# Patient Record
Sex: Male | Born: 1960 | Race: Black or African American | Hispanic: No | Marital: Single | Smoking: Current every day smoker
Health system: Southern US, Community
[De-identification: ages and names within clinical notes are randomized; demographics above are authoritative.]

## PROBLEM LIST (undated history)

## (undated) DIAGNOSIS — M1990 Unspecified osteoarthritis, unspecified site: Secondary | ICD-10-CM

## (undated) HISTORY — PX: FRACTURE SURGERY: SHX138

---

## 2015-12-31 ENCOUNTER — Emergency Department (HOSPITAL_COMMUNITY)
Admission: EM | Admit: 2015-12-31 | Discharge: 2015-12-31 | Disposition: A | Discharge status: Home / Self Care | Payer: Self-pay | Attending: Emergency Medicine | Admitting: Emergency Medicine

## 2015-12-31 ENCOUNTER — Encounter (HOSPITAL_COMMUNITY): Payer: Self-pay | Admitting: Family Medicine

## 2015-12-31 DIAGNOSIS — F17210 Nicotine dependence, cigarettes, uncomplicated: Secondary | ICD-10-CM | POA: Insufficient documentation

## 2015-12-31 DIAGNOSIS — R197 Diarrhea, unspecified: Secondary | ICD-10-CM | POA: Insufficient documentation

## 2015-12-31 DIAGNOSIS — K219 Gastro-esophageal reflux disease without esophagitis: Secondary | ICD-10-CM | POA: Insufficient documentation

## 2015-12-31 DIAGNOSIS — R112 Nausea with vomiting, unspecified: Secondary | ICD-10-CM

## 2015-12-31 LAB — URINALYSIS, ROUTINE W REFLEX MICROSCOPIC
BILIRUBIN URINE: NEGATIVE
GLUCOSE, UA: NEGATIVE mg/dL
HGB URINE DIPSTICK: NEGATIVE
KETONES UR: NEGATIVE mg/dL
LEUKOCYTES UA: NEGATIVE
Nitrite: NEGATIVE
PH: 5 (ref 5.0–8.0)
PROTEIN: NEGATIVE mg/dL
Specific Gravity, Urine: 1.023 (ref 1.005–1.030)

## 2015-12-31 LAB — COMPREHENSIVE METABOLIC PANEL
ALBUMIN: 3.8 g/dL (ref 3.5–5.0)
ALK PHOS: 45 U/L (ref 38–126)
ALT: 12 U/L — ABNORMAL LOW (ref 17–63)
ANION GAP: 9 (ref 5–15)
AST: 21 U/L (ref 15–41)
BILIRUBIN TOTAL: 0.7 mg/dL (ref 0.3–1.2)
BUN: 7 mg/dL (ref 6–20)
CALCIUM: 9.2 mg/dL (ref 8.9–10.3)
CO2: 22 mmol/L (ref 22–32)
Chloride: 109 mmol/L (ref 101–111)
Creatinine, Ser: 0.96 mg/dL (ref 0.61–1.24)
GLUCOSE: 97 mg/dL (ref 65–99)
Potassium: 3.8 mmol/L (ref 3.5–5.1)
Sodium: 140 mmol/L (ref 135–145)
TOTAL PROTEIN: 6.7 g/dL (ref 6.5–8.1)

## 2015-12-31 LAB — CBC
HEMATOCRIT: 40.6 % (ref 39.0–52.0)
Hemoglobin: 12.9 g/dL — ABNORMAL LOW (ref 13.0–17.0)
MCH: 29 pg (ref 26.0–34.0)
MCHC: 31.8 g/dL (ref 30.0–36.0)
MCV: 91.2 fL (ref 78.0–100.0)
PLATELETS: 203 10*3/uL (ref 150–400)
RBC: 4.45 MIL/uL (ref 4.22–5.81)
RDW: 15 % (ref 11.5–15.5)
WBC: 5.6 10*3/uL (ref 4.0–10.5)

## 2015-12-31 LAB — LIPASE, BLOOD: Lipase: 17 U/L (ref 11–51)

## 2015-12-31 MED ORDER — OMEPRAZOLE 20 MG PO CPDR: 20.0000 mg | DELAYED_RELEASE_CAPSULE | Freq: Every day | ORAL | Status: DC

## 2015-12-31 MED ORDER — ONDANSETRON HCL 4 MG/2ML IJ SOLN
4.0000 mg | Freq: Once | INTRAMUSCULAR | Status: AC
  Administered 2015-12-31: 4 mg via INTRAVENOUS
  Filled 2015-12-31: qty 2

## 2015-12-31 MED ORDER — GI COCKTAIL ~~LOC~~
30.0000 mL | Freq: Once | ORAL | Status: AC
  Administered 2015-12-31: 30 mL via ORAL
  Filled 2015-12-31: qty 30

## 2015-12-31 MED ORDER — SODIUM CHLORIDE 0.9 % IV BOLUS (SEPSIS)
1000.0000 mL | Freq: Once | INTRAVENOUS | Status: AC
  Administered 2015-12-31: 1000 mL via INTRAVENOUS

## 2015-12-31 MED ORDER — ONDANSETRON 4 MG PO TBDP: 4.0000 mg | ORAL_TABLET | Freq: Three times a day (TID) | ORAL | Status: DC | PRN

## 2015-12-31 NOTE — Discharge Instructions (Signed)

## 2015-12-31 NOTE — ED Provider Notes (Signed)
CSN: [id number]     Arrival date & time 12/31/15  1613 History   First MD Initiated Contact with Patient 12/31/15 1910     Chief Complaint  Patient presents with  . Abdominal Pain  . Nausea  . Emesis    Travis Heath is a 55 y.o. who presents to the ED complaining of nausea, vomiting, diarrhea and generalized abdominal pain since this morning. He believes he has a stomach virus after eating some bad mayonnaise. He reports he's had 3 episodes of vomiting and multiple episodes of diarrhea today. He reports feeling nauseated. No hematemesis or hematochezia. He reports he's had lots of burping and belching. He reports his belching smells like roten eggs. No previous abdominal surgeries. He has taken nothing for treatment of his symptoms today. He denies fevers, urinary symptoms, hematemesis, hematochezia, lightheadedness, dizziness, syncope or rashes.   Patient is a 55 y.o. male presenting with abdominal pain and vomiting. The history is provided by the patient. No language interpreter was used.  Abdominal Pain Associated symptoms: diarrhea, nausea and vomiting   Associated symptoms: no chest pain, no chills, no cough, no dysuria, no fever, no hematuria, no shortness of breath and no sore throat   Emesis Associated symptoms: abdominal pain and diarrhea   Associated symptoms: no chills, no headaches and no sore throat     History reviewed. No pertinent past medical history. History reviewed. No pertinent past surgical history. History reviewed. No pertinent family history. Social History  Substance Use Topics  . Smoking status: Current Every Day Smoker    Types: Cigarettes  . Smokeless tobacco: None  . Alcohol Use: No    Review of Systems  Constitutional: Negative for fever and chills.  HENT: Negative for congestion and sore throat.   Eyes: Negative for visual disturbance.  Respiratory: Negative for cough and shortness of breath.   Cardiovascular: Negative for chest pain.   Gastrointestinal: Positive for nausea, vomiting, abdominal pain and diarrhea. Negative for blood in stool and abdominal distention.  Genitourinary: Negative for dysuria, urgency, frequency, hematuria and difficulty urinating.  Musculoskeletal: Negative for back pain and neck pain.  Skin: Negative for rash.  Neurological: Negative for syncope and headaches.      Allergies  Review of patient's allergies indicates no known allergies.  Home Medications   Prior to Admission medications   Medication Sig Start Date End Date Taking? Authorizing Provider  acetaminophen (TYLENOL) 500 MG tablet Take 500 mg by mouth every 6 (six) hours as needed for moderate pain.   Yes Historical Provider, MD  omeprazole (PRILOSEC) 20 MG capsule Take 1 capsule (20 mg total) by mouth daily. 12/31/15   Everlene Farrier, PA-C  ondansetron (ZOFRAN ODT) 4 MG disintegrating tablet Take 1 tablet (4 mg total) by mouth every 8 (eight) hours as needed for nausea or vomiting. 12/31/15   Everlene Farrier, PA-C   BP 126/73 mmHg  Pulse 54  Temp(Src) 98.1 F (36.7 C)  Resp 18  SpO2 100% Physical Exam  Constitutional: He appears well-developed and well-nourished. No distress.  Nontoxic appearing.  HENT:  Head: Normocephalic and atraumatic.  Mouth/Throat: Oropharynx is clear and moist.  Eyes: Conjunctivae are normal. Pupils are equal, round, and reactive to light. Right eye exhibits no discharge. Left eye exhibits no discharge.  Neck: Neck supple.  Cardiovascular: Normal rate, regular rhythm, normal heart sounds and intact distal pulses.   Pulmonary/Chest: Effort normal and breath sounds normal. No respiratory distress. He has no wheezes. He has no rales.  Abdominal:  Soft. Bowel sounds are normal. He exhibits no distension and no mass. There is no tenderness. There is no rebound and no guarding.  Abdomen soft and nontender to palpation. Bowel sounds are present. No peritoneal signs.  Musculoskeletal: He exhibits no edema or  tenderness.  Lymphadenopathy:    He has no cervical adenopathy.  Neurological: He is alert. Coordination normal.  Skin: Skin is warm and dry. No rash noted. He is not diaphoretic. No erythema. No pallor.  Psychiatric: He has a normal mood and affect. His behavior is normal.  Nursing note and vitals reviewed.   ED Course  Procedures (including critical care time) Labs Review Labs Reviewed  COMPREHENSIVE METABOLIC PANEL - Abnormal; Notable for the following:    ALT 12 (*)    All other components within normal limits  CBC - Abnormal; Notable for the following:    Hemoglobin 12.9 (*)    All other components within normal limits  LIPASE, BLOOD  URINALYSIS, ROUTINE W REFLEX MICROSCOPIC ([facility])    Imaging Review No results found. I have personally reviewed and evaluated these lab results as part of my medical decision-making.   EKG Interpretation None     Filed Vitals:   12/31/15 1621  BP: 126/73  Pulse: 54  Temp: 98.1 F (36.7 C)  Resp: 18  SpO2: 100%    MDM   Meds given in ED:  Medications  sodium chloride 0.9 % bolus 1,000 mL (1,000 mLs Intravenous New Bag/Given 12/31/15 1956)  ondansetron (ZOFRAN) injection 4 mg (4 mg Intravenous Given 12/31/15 1957)  gi cocktail (Maalox,Lidocaine,Donnatal) (30 mLs Oral Given 12/31/15 1956)    New Prescriptions   OMEPRAZOLE (PRILOSEC) 20 MG CAPSULE    Take 1 capsule (20 mg total) by mouth daily.   ONDANSETRON (ZOFRAN ODT) 4 MG DISINTEGRATING TABLET    Take 1 tablet (4 mg total) by mouth every 8 (eight) hours as needed for nausea or vomiting.    Final diagnoses:  Nausea vomiting and diarrhea  Gastroesophageal reflux disease, esophagitis presence not specified   This is a 55 y.o. who presents to the ED complaining of nausea, vomiting, diarrhea and generalized abdominal pain since this morning. He believes he has a stomach virus after eating some bad mayonnaise. He reports he's had 3 episodes of vomiting and multiple episodes of  diarrhea today. He reports feeling nauseated. No hematemesis or hematochezia. He reports he's had lots of burping and belching. He reports his belching smells like roten eggs. No previous abdominal surgeries. On exam the patient is afebrile and nontoxic appearing. His abdomen is soft and nontender to palpation. No peritoneal signs. He is belching during my interview. Lipase is within normal limits. CMP is unremarkable. CBC is unremarkable. Urinalysis is within normal limits. No signs of infection. No leukocytosis on CBC. Patient received GI cocktail, Zofran and fluid bolus. At reevaluation patient feels much better. He has tolerated ginger ale without vomiting. Will discharge with prescriptions for Zofran and omeprazole. I encouraged him to push oral fluids and to use over-the-counter loperamide if needed for any continued diarrhea. He has had no diarrhea or vomiting while in the emergency department. I discussed strict and specific return precautions. I advised the patient to follow-up with their primary care provider this week. I advised the patient to return to the emergency department with new or worsening symptoms or new concerns. The patient verbalized understanding and agreement with plan.    Everlene FarrierWilliam Keeghan Bialy, PA-C 12/31/15 2123  Margarita Grizzleanielle Ray, MD 01/01/16 0010

## 2015-12-31 NOTE — ED Notes (Signed)
Pt here for mid abd pain(sts feels like a knot) with N,V,D that started this am. sts he is burping up eggs and possibly ate some bad mayo.

## 2016-02-12 ENCOUNTER — Emergency Department (HOSPITAL_COMMUNITY)
Admission: EM | Admit: 2016-02-12 | Discharge: 2016-02-12 | Disposition: A | Discharge status: Home / Self Care | Payer: Self-pay | Attending: Emergency Medicine | Admitting: Emergency Medicine

## 2016-02-12 ENCOUNTER — Encounter (HOSPITAL_COMMUNITY): Payer: Self-pay | Admitting: Emergency Medicine

## 2016-02-12 DIAGNOSIS — F17210 Nicotine dependence, cigarettes, uncomplicated: Secondary | ICD-10-CM | POA: Insufficient documentation

## 2016-02-12 DIAGNOSIS — R197 Diarrhea, unspecified: Secondary | ICD-10-CM | POA: Insufficient documentation

## 2016-02-12 DIAGNOSIS — R001 Bradycardia, unspecified: Secondary | ICD-10-CM | POA: Insufficient documentation

## 2016-02-12 DIAGNOSIS — Z79899 Other long term (current) drug therapy: Secondary | ICD-10-CM | POA: Insufficient documentation

## 2016-02-12 DIAGNOSIS — R109 Unspecified abdominal pain: Secondary | ICD-10-CM | POA: Insufficient documentation

## 2016-02-12 LAB — URINALYSIS, ROUTINE W REFLEX MICROSCOPIC
Bilirubin Urine: NEGATIVE
GLUCOSE, UA: NEGATIVE mg/dL
Hgb urine dipstick: NEGATIVE
KETONES UR: NEGATIVE mg/dL
LEUKOCYTES UA: NEGATIVE
NITRITE: NEGATIVE
PH: 6.5 (ref 5.0–8.0)
PROTEIN: NEGATIVE mg/dL
Specific Gravity, Urine: 1.029 (ref 1.005–1.030)

## 2016-02-12 LAB — COMPREHENSIVE METABOLIC PANEL
ALT: 11 U/L — ABNORMAL LOW (ref 17–63)
ANION GAP: 5 (ref 5–15)
AST: 17 U/L (ref 15–41)
Albumin: 3.7 g/dL (ref 3.5–5.0)
Alkaline Phosphatase: 52 U/L (ref 38–126)
BILIRUBIN TOTAL: 0.6 mg/dL (ref 0.3–1.2)
BUN: 8 mg/dL (ref 6–20)
CHLORIDE: 108 mmol/L (ref 101–111)
CO2: 28 mmol/L (ref 22–32)
Calcium: 9.5 mg/dL (ref 8.9–10.3)
Creatinine, Ser: 1.08 mg/dL (ref 0.61–1.24)
Glucose, Bld: 117 mg/dL — ABNORMAL HIGH (ref 65–99)
POTASSIUM: 4.5 mmol/L (ref 3.5–5.1)
Sodium: 141 mmol/L (ref 135–145)
TOTAL PROTEIN: 6.7 g/dL (ref 6.5–8.1)

## 2016-02-12 LAB — CBC
HEMATOCRIT: 41.4 % (ref 39.0–52.0)
Hemoglobin: 13 g/dL (ref 13.0–17.0)
MCH: 29.4 pg (ref 26.0–34.0)
MCHC: 31.4 g/dL (ref 30.0–36.0)
MCV: 93.7 fL (ref 78.0–100.0)
Platelets: 215 10*3/uL (ref 150–400)
RBC: 4.42 MIL/uL (ref 4.22–5.81)
RDW: 14.2 % (ref 11.5–15.5)
WBC: 6.9 10*3/uL (ref 4.0–10.5)

## 2016-02-12 LAB — LIPASE, BLOOD: LIPASE: 22 U/L (ref 11–51)

## 2016-02-12 MED ORDER — ONDANSETRON HCL 4 MG PO TABS: 4.0000 mg | ORAL_TABLET | Freq: Four times a day (QID) | ORAL | Status: DC

## 2016-02-12 MED ORDER — ONDANSETRON 4 MG PO TBDP
8.0000 mg | ORAL_TABLET | Freq: Once | ORAL | Status: AC
  Administered 2016-02-12: 8 mg via ORAL
  Filled 2016-02-12: qty 2

## 2016-02-12 NOTE — ED Notes (Signed)
Patient able to ambulate independently  

## 2016-02-12 NOTE — ED Provider Notes (Signed)
CSN: [id number]     Arrival date & time 02/12/16  1556 History   First MD Initiated Contact with Patient 02/12/16 2001     Chief Complaint  Patient presents with  . Abdominal Pain  . Diarrhea     (Consider location/radiation/quality/duration/timing/severity/associated sxs/prior Treatment) HPI Comments: Patient presents to the emergency department with chief complaint of abdominal cramping and diarrhea. Patient states that he began having the symptoms at work today. States that he had 4 episodes of watery diarrhea. Denies seeing any blood in his stool. Denies any vomiting, but does state that he felt slightly nauseated. He denies any associated fevers or chills. There are no modifying factors. He states that he is now feeling much better.  The history is provided by the patient. No language interpreter was used.    History reviewed. No pertinent past medical history. Past Surgical History  Procedure Laterality Date  . Fracture surgery      facial    No family history on file. Social History  Substance Use Topics  . Smoking status: Current Every Day Smoker -- 0.50 packs/day    Types: Cigarettes  . Smokeless tobacco: None  . Alcohol Use: None    Review of Systems  Constitutional: Negative for fever and chills.  Respiratory: Negative for shortness of breath.   Cardiovascular: Negative for chest pain.  Gastrointestinal: Positive for diarrhea. Negative for nausea, vomiting and constipation.  Genitourinary: Negative for dysuria.  All other systems reviewed and are negative.     Allergies  Review of patient's allergies indicates no known allergies.  Home Medications   Prior to Admission medications   Medication Sig Start Date End Date Taking? Authorizing Provider  acetaminophen (TYLENOL) 500 MG tablet Take 500 mg by mouth every 6 (six) hours as needed for moderate pain.   Yes Historical Provider, MD  omeprazole (PRILOSEC) 20 MG capsule Take 1 capsule (20 mg total) by mouth  daily. 12/31/15   Everlene FarrierWilliam Dansie, PA-C  ondansetron (ZOFRAN ODT) 4 MG disintegrating tablet Take 1 tablet (4 mg total) by mouth every 8 (eight) hours as needed for nausea or vomiting. 12/31/15   Everlene FarrierWilliam Dansie, PA-C   BP 132/93 mmHg  Pulse 49  Temp(Src) 97.8 F (36.6 C) (Oral)  Resp 18  Ht 5\' 11"  (1.803 m)  Wt 83.915 kg  BMI 25.81 kg/m2  SpO2 100% Physical Exam  Constitutional: He is oriented to person, place, and time. He appears well-developed and well-nourished.  HENT:  Head: Normocephalic and atraumatic.  Eyes: Conjunctivae and EOM are normal. Pupils are equal, round, and reactive to light. Right eye exhibits no discharge. Left eye exhibits no discharge. No scleral icterus.  Neck: Normal range of motion. Neck supple. No JVD present.  Cardiovascular: Regular rhythm and normal heart sounds.  Exam reveals no gallop and no friction rub.   No murmur heard. bradycardic  Pulmonary/Chest: Effort normal and breath sounds normal. No respiratory distress. He has no wheezes. He has no rales. He exhibits no tenderness.  Abdominal: Soft. He exhibits no distension and no mass. There is no tenderness. There is no rebound and no guarding.  No focal abdominal tenderness, no RLQ tenderness or pain at McBurney's point, no RUQ tenderness or Murphy's sign, no left-sided abdominal tenderness, no fluid wave, or signs of peritonitis   Musculoskeletal: Normal range of motion. He exhibits no edema or tenderness.  Neurological: He is alert and oriented to person, place, and time.  Skin: Skin is warm and dry.  Psychiatric: He has  a normal mood and affect. His behavior is normal. Judgment and thought content normal.  Nursing note and vitals reviewed.   ED Course  Procedures (including critical care time) Results for orders placed or performed during the hospital encounter of 02/12/16  Lipase, blood  Result Value Ref Range   Lipase 22 11 - 51 U/L  Comprehensive metabolic panel  Result Value Ref Range    Sodium 141 135 - 145 mmol/L   Potassium 4.5 3.5 - 5.1 mmol/L   Chloride 108 101 - 111 mmol/L   CO2 28 22 - 32 mmol/L   Glucose, Bld 117 (H) 65 - 99 mg/dL   BUN 8 6 - 20 mg/dL   Creatinine, Ser 1.61 0.61 - 1.24 mg/dL   Calcium 9.5 8.9 - 09.6 mg/dL   Total Protein 6.7 6.5 - 8.1 g/dL   Albumin 3.7 3.5 - 5.0 g/dL   AST 17 15 - 41 U/L   ALT 11 (L) 17 - 63 U/L   Alkaline Phosphatase 52 38 - 126 U/L   Total Bilirubin 0.6 0.3 - 1.2 mg/dL   GFR calc non Af Amer >60 >60 mL/min   GFR calc Af Amer >60 >60 mL/min   Anion gap 5 5 - 15  CBC  Result Value Ref Range   WBC 6.9 4.0 - 10.5 K/uL   RBC 4.42 4.22 - 5.81 MIL/uL   Hemoglobin 13.0 13.0 - 17.0 g/dL   HCT 04.5 40.9 - 81.1 %   MCV 93.7 78.0 - 100.0 fL   MCH 29.4 26.0 - 34.0 pg   MCHC 31.4 30.0 - 36.0 g/dL   RDW 91.4 78.2 - 95.6 %   Platelets 215 150 - 400 K/uL  Urinalysis, Routine w reflex microscopic  Result Value Ref Range   Color, Urine YELLOW YELLOW   APPearance CLEAR CLEAR   Specific Gravity, Urine 1.029 1.005 - 1.030   pH 6.5 5.0 - 8.0   Glucose, UA NEGATIVE NEGATIVE mg/dL   Hgb urine dipstick NEGATIVE NEGATIVE   Bilirubin Urine NEGATIVE NEGATIVE   Ketones, ur NEGATIVE NEGATIVE mg/dL   Protein, ur NEGATIVE NEGATIVE mg/dL   Nitrite NEGATIVE NEGATIVE   Leukocytes, UA NEGATIVE NEGATIVE   No results found.  I have personally reviewed and evaluated these images and lab results as part of my medical decision-making.    MDM   Final diagnoses:  Abdominal cramps  Diarrhea, unspecified type    Patient with an episode of crampy abdominal pain earlier today followed by 4 episodes diarrhea. He states that he is feeling much better.  Labs ordered in triage are unremarkable. Vital signs are stable. He has no focal abdominal tenderness on exam. He is well-appearing. Will discharge with some Zofran for nausea, and encourage increasing fluids for diarrhea. Specific return precautions given. Patient understands and agrees the plan. He  is stable and ready for discharge.   Roxy Horseman, PA-C 02/12/16 2130  Cathren Laine, MD 02/12/16 [id number]

## 2016-02-12 NOTE — ED Notes (Signed)
Patient states he was working today and started having watery diarrhea with abdominal pain x 2.

## 2016-02-12 NOTE — Discharge Instructions (Signed)
Abdominal Pain, Adult °Many things can cause abdominal pain. Usually, abdominal pain is not caused by a disease and will improve without treatment. It can often be observed and treated at home. Your health care provider will do a physical exam and possibly order blood tests and X-rays to help determine the seriousness of your pain. However, in many cases, more time must pass before a clear cause of the pain can be found. Before that point, your health care provider may not know if you need more testing or further treatment. °HOME CARE INSTRUCTIONS °Monitor your abdominal pain for any changes. The following actions may help to alleviate any discomfort you are experiencing: °· Only take over-the-counter or prescription medicines as directed by your health care provider. °· Do not take laxatives unless directed to do so by your health care provider. °· Try a clear liquid diet (broth, tea, or water) as directed by your health care provider. Slowly move to a bland diet as tolerated. °SEEK MEDICAL CARE IF: °· You have unexplained abdominal pain. °· You have abdominal pain associated with nausea or diarrhea. °· You have pain when you urinate or have a bowel movement. °· You experience abdominal pain that wakes you in the night. °· You have abdominal pain that is worsened or improved by eating food. °· You have abdominal pain that is worsened with eating fatty foods. °· You have a fever. °SEEK IMMEDIATE MEDICAL CARE IF: °· Your pain does not go away within 2 hours. °· You keep throwing up (vomiting). °· Your pain is felt only in portions of the abdomen, such as the right side or the left lower portion of the abdomen. °· You pass bloody or black tarry stools. °MAKE SURE YOU: °· Understand these instructions. °· Will watch your condition. °· Will get help right away if you are not doing well or get worse. °  °This information is not intended to replace advice given to you by your health care provider. Make sure you discuss  any questions you have with your health care provider. °  °Document Released: 05/22/2005 Document Revised: 05/03/2015 Document Reviewed: 04/21/2013 °Elsevier Interactive Patient Education ©2016 [employer] ° °Diarrhea °Diarrhea is frequent loose and watery bowel movements. It can cause you to feel weak and dehydrated. Dehydration can cause you to become tired and thirsty, have a dry mouth, and have decreased urination that often is dark yellow. Diarrhea is a sign of another problem, most often an infection that will not last long. In most cases, diarrhea typically lasts 2-3 days. However, it can last longer if it is a sign of something more serious. It is important to treat your diarrhea as directed by your caregiver to lessen or prevent future episodes of diarrhea. °CAUSES  °Some common causes include: °· Gastrointestinal infections caused by viruses, bacteria, or parasites. °· Food poisoning or food allergies. °· Certain medicines, such as antibiotics, chemotherapy, and laxatives. °· Artificial sweeteners and fructose. °· Digestive disorders. °HOME CARE INSTRUCTIONS °· Ensure adequate fluid intake (hydration): Have 1 cup (8 oz) of fluid for each diarrhea episode. Avoid fluids that contain simple sugars or sports drinks, fruit juices, whole milk products, and sodas. Your urine should be clear or pale yellow if you are drinking enough fluids. Hydrate with an oral rehydration solution that you can purchase at pharmacies, retail stores, and online. You can prepare an oral rehydration solution at home by mixing the following ingredients together: °¨  - tsp table salt. °¨ ¾ tsp baking soda. °¨    tsp salt substitute containing potassium chloride. °¨ 1  tablespoons sugar. °¨ 1 L (34 oz) of water. °· Certain foods and beverages may increase the speed at which food moves through the gastrointestinal (GI) tract. These foods and beverages should be avoided and include: °¨ Caffeinated and alcoholic beverages. °¨ High-fiber  foods, such as raw fruits and vegetables, nuts, seeds, and whole grain breads and cereals. °¨ Foods and beverages sweetened with sugar alcohols, such as xylitol, sorbitol, and mannitol. °· Some foods may be well tolerated and may help thicken stool including: °¨ Starchy foods, such as rice, toast, pasta, low-sugar cereal, oatmeal, grits, baked potatoes, crackers, and bagels. °¨ Bananas. °¨ Applesauce. °· Add probiotic-rich foods to help increase healthy bacteria in the GI tract, such as yogurt and fermented milk products. °· Wash your hands well after each diarrhea episode. °· Only take over-the-counter or prescription medicines as directed by your caregiver. °· Take a warm bath to relieve any burning or pain from frequent diarrhea episodes. °SEEK IMMEDIATE MEDICAL CARE IF:  °· You are unable to keep fluids down. °· You have persistent vomiting. °· You have blood in your stool, or your stools are black and tarry. °· You do not urinate in 6-8 hours, or there is only a small amount of very dark urine. °· You have abdominal pain that increases or localizes. °· You have weakness, dizziness, confusion, or light-headedness. °· You have a severe headache. °· Your diarrhea gets worse or does not get better. °· You have a fever or persistent symptoms for more than 2-3 days. °· You have a fever and your symptoms suddenly get worse. °MAKE SURE YOU:  °· Understand these instructions. °· Will watch your condition. °· Will get help right away if you are not doing well or get worse. °  °This information is not intended to replace advice given to you by your health care provider. Make sure you discuss any questions you have with your health care provider. °  °Document Released: 08/02/2002 Document Revised: 09/02/2014 Document Reviewed: 04/19/2012 °Elsevier Interactive Patient Education ©2016 [employer] ° °

## 2016-02-12 NOTE — ED Notes (Signed)
Rob - PA aware of pt's pulse.

## 2017-09-18 ENCOUNTER — Other Ambulatory Visit: Payer: Self-pay

## 2017-09-18 ENCOUNTER — Encounter (HOSPITAL_COMMUNITY): Payer: Self-pay

## 2017-09-18 ENCOUNTER — Emergency Department (HOSPITAL_COMMUNITY)
Admission: EM | Admit: 2017-09-18 | Discharge: 2017-09-18 | Disposition: A | Discharge status: Home / Self Care | Payer: Self-pay | Attending: Emergency Medicine | Admitting: Emergency Medicine

## 2017-09-18 ENCOUNTER — Emergency Department (HOSPITAL_COMMUNITY): Payer: Self-pay

## 2017-09-18 DIAGNOSIS — F17210 Nicotine dependence, cigarettes, uncomplicated: Secondary | ICD-10-CM | POA: Insufficient documentation

## 2017-09-18 DIAGNOSIS — J111 Influenza due to unidentified influenza virus with other respiratory manifestations: Secondary | ICD-10-CM | POA: Insufficient documentation

## 2017-09-18 DIAGNOSIS — J42 Unspecified chronic bronchitis: Secondary | ICD-10-CM | POA: Insufficient documentation

## 2017-09-18 DIAGNOSIS — Z79899 Other long term (current) drug therapy: Secondary | ICD-10-CM | POA: Insufficient documentation

## 2017-09-18 DIAGNOSIS — R69 Illness, unspecified: Secondary | ICD-10-CM

## 2017-09-18 HISTORY — DX: Unspecified osteoarthritis, unspecified site: M19.90

## 2017-09-18 LAB — COMPREHENSIVE METABOLIC PANEL
ALBUMIN: 3.5 g/dL (ref 3.5–5.0)
ALT: 15 U/L — ABNORMAL LOW (ref 17–63)
ANION GAP: 11 (ref 5–15)
AST: 24 U/L (ref 15–41)
Alkaline Phosphatase: 57 U/L (ref 38–126)
BILIRUBIN TOTAL: 0.5 mg/dL (ref 0.3–1.2)
BUN: 10 mg/dL (ref 6–20)
CHLORIDE: 101 mmol/L (ref 101–111)
CO2: 24 mmol/L (ref 22–32)
Calcium: 9 mg/dL (ref 8.9–10.3)
Creatinine, Ser: 1.07 mg/dL (ref 0.61–1.24)
GFR calc Af Amer: >60 mL/min (ref >60)
GFR calc non Af Amer: >60 mL/min (ref >60)
GLUCOSE: 139 mg/dL — AB (ref 65–99)
POTASSIUM: 4 mmol/L (ref 3.5–5.1)
SODIUM: 136 mmol/L (ref 135–145)
TOTAL PROTEIN: 6.9 g/dL (ref 6.5–8.1)

## 2017-09-18 LAB — CBC WITH DIFFERENTIAL/PLATELET
BASOS ABS: 0.1 10*3/uL (ref 0.0–0.1)
Basophils Relative: 1 %
Eosinophils Absolute: 0.1 10*3/uL (ref 0.0–0.7)
Eosinophils Relative: 2 %
HCT: 40 % (ref 39.0–52.0)
HEMOGLOBIN: 13.6 g/dL (ref 13.0–17.0)
LYMPHS PCT: 26 %
Lymphs Abs: 1.4 10*3/uL (ref 0.7–4.0)
MCH: 32 pg (ref 26.0–34.0)
MCHC: 34 g/dL (ref 30.0–36.0)
MCV: 94.1 fL (ref 78.0–100.0)
MONO ABS: 0.7 10*3/uL (ref 0.1–1.0)
Monocytes Relative: 12 %
NEUTROS ABS: 3.2 10*3/uL (ref 1.7–7.7)
Neutrophils Relative %: 59 %
PLATELETS: 214 10*3/uL (ref 150–400)
RBC: 4.25 MIL/uL (ref 4.22–5.81)
RDW: 13.6 % (ref 11.5–15.5)
WBC: 5.5 10*3/uL (ref 4.0–10.5)

## 2017-09-18 LAB — URINALYSIS, ROUTINE W REFLEX MICROSCOPIC
BILIRUBIN URINE: NEGATIVE
GLUCOSE, UA: NEGATIVE mg/dL
HGB URINE DIPSTICK: NEGATIVE
KETONES UR: NEGATIVE mg/dL
Leukocytes, UA: NEGATIVE
Nitrite: NEGATIVE
PROTEIN: NEGATIVE mg/dL
Specific Gravity, Urine: 1.016 (ref 1.005–1.030)
pH: 5 (ref 5.0–8.0)

## 2017-09-18 LAB — I-STAT CG4 LACTIC ACID, ED: Lactic Acid, Venous: 1.03 mmol/L (ref 0.5–1.9)

## 2017-09-18 MED ORDER — ALBUTEROL SULFATE HFA 108 (90 BASE) MCG/ACT IN AERS
2.0000 | INHALATION_SPRAY | Freq: Once | RESPIRATORY_TRACT | Status: AC
  Administered 2017-09-18: 2 via RESPIRATORY_TRACT
  Filled 2017-09-18: qty 6.7

## 2017-09-18 MED ORDER — IPRATROPIUM-ALBUTEROL 0.5-2.5 (3) MG/3ML IN SOLN
3.0000 mL | Freq: Once | RESPIRATORY_TRACT | Status: AC
  Administered 2017-09-18: 3 mL via RESPIRATORY_TRACT
  Filled 2017-09-18: qty 3

## 2017-09-18 MED ORDER — MORPHINE SULFATE (PF) 4 MG/ML IV SOLN
4.0000 mg | Freq: Once | INTRAVENOUS | Status: AC
Start: 2017-09-18 — End: 2017-09-18
  Administered 2017-09-18: 4 mg via INTRAVENOUS
  Filled 2017-09-18: qty 1

## 2017-09-18 MED ORDER — HYDROCODONE-ACETAMINOPHEN 5-325 MG PO TABS: ORAL_TABLET | ORAL | 0 refills | Status: DC

## 2017-09-18 MED ORDER — ACETAMINOPHEN 325 MG PO TABS
650.0000 mg | ORAL_TABLET | Freq: Once | ORAL | Status: AC | PRN
  Administered 2017-09-18: 650 mg via ORAL
  Filled 2017-09-18: qty 2

## 2017-09-18 MED ORDER — ONDANSETRON HCL 4 MG/2ML IJ SOLN
4.0000 mg | Freq: Once | INTRAMUSCULAR | Status: AC
Start: 2017-09-18 — End: 2017-09-18
  Administered 2017-09-18: 4 mg via INTRAVENOUS
  Filled 2017-09-18: qty 2

## 2017-09-18 MED ORDER — DOXYCYCLINE HYCLATE 100 MG PO CAPS: 100.0000 mg | ORAL_CAPSULE | Freq: Two times a day (BID) | ORAL | 0 refills | Status: DC

## 2017-09-18 MED ORDER — PREDNISONE 20 MG PO TABS
60.0000 mg | ORAL_TABLET | Freq: Once | ORAL | Status: AC
Start: 2017-09-18 — End: 2017-09-18
  Administered 2017-09-18: 60 mg via ORAL
  Filled 2017-09-18: qty 3

## 2017-09-18 MED ORDER — KETOROLAC TROMETHAMINE 30 MG/ML IJ SOLN
15.0000 mg | Freq: Once | INTRAMUSCULAR | Status: AC
  Administered 2017-09-18: 15 mg via INTRAVENOUS
  Filled 2017-09-18: qty 1

## 2017-09-18 MED ORDER — SODIUM CHLORIDE 0.9 % IV BOLUS (SEPSIS)
1000.0000 mL | Freq: Once | INTRAVENOUS | Status: AC
  Administered 2017-09-18: 1000 mL via INTRAVENOUS

## 2017-09-18 NOTE — Discharge Instructions (Signed)

## 2017-09-18 NOTE — ED Provider Notes (Signed)
[facility] EMERGENCY DEPARTMENT Provider Note   CSN: [id number] Arrival date & time: 09/18/17  1243     History   Chief Complaint Chief Complaint  Patient presents with  . Cough  . Generalized Body Aches   HPI   Blood pressure 129/75, pulse 72, temperature (!) 102.2 F (39 C), temperature source Oral, resp. rate (!) 22, height 6' (1.829 m), weight 88.5 kg (195 lb), SpO2 97 %.  Travis Heath is a 57 y.o. male with no significant past medical history, active daily smoker complaining of myalgia, productive cough, exacerbation of his chronic right shoulder and neck pain with tactile fever and chills and generalized malaise with rhinorrhea, sore throat onset 4 days ago.  He denies focal chest pain, shortness of breath, abdominal pain, nausea vomiting, change in urination.  He did not get a flu shot this year, he is got multiple sick contacts.  Past Medical History:  Diagnosis Date  . Arthritis     There are no active problems to display for this patient.   Past Surgical History:  Procedure Laterality Date  . FRACTURE SURGERY     facial        Home Medications    Prior to Admission medications   Medication Sig Start Date End Date Taking? Authorizing Provider  acetaminophen (TYLENOL) 500 MG tablet Take 500 mg by mouth every 6 (six) hours as needed for moderate pain.    [provider]  doxycycline (VIBRAMYCIN) 100 MG capsule Take 1 capsule (100 mg total) by mouth 2 (two) times daily. 09/18/17   Stavros Cail, Joni Reining, PA-C  HYDROcodone-acetaminophen (NORCO/VICODIN) 5-325 MG tablet Take 1-2 tablets by mouth every 6 hours as needed for pain and/or cough. 09/18/17   Sebasthian Stailey, Joni Reining, PA-C  omeprazole (PRILOSEC) 20 MG capsule Take 1 capsule (20 mg total) by mouth daily. 12/31/15   Everlene Farrier, PA-C  ondansetron (ZOFRAN ODT) 4 MG disintegrating tablet Take 1 tablet (4 mg total) by mouth every 8 (eight) hours as needed for nausea or vomiting. 12/31/15   Everlene Farrier, PA-C  ondansetron (ZOFRAN) 4 MG tablet Take 1 tablet (4 mg total) by mouth every 6 (six) hours. 02/12/16   Roxy Horseman, PA-C    Family History No family history on file.  Social History Social History   Tobacco Use  . Smoking status: Current Every Day Smoker    Packs/day: 0.50    Types: Cigarettes  . Smokeless tobacco: Never Used  Substance Use Topics  . Alcohol use: Not on file  . Drug use: No     Allergies   Patient has no known allergies.   Review of Systems Review of Systems  A complete review of systems was obtained and all systems are negative except as noted in the HPI and PMH.    Physical Exam Updated Vital Signs BP 120/70 (BP Location: Right Arm)   Pulse 70   Temp (!) 100.4 F (38 C) (Oral)   Resp 18   Ht 6' (1.829 m)   Wt 88.5 kg (195 lb)   SpO2 99%   BMI 26.45 kg/m   Physical Exam  Constitutional: He is oriented to person, place, and time. He appears well-developed and well-nourished. No distress.  HENT:  Head: Normocephalic and atraumatic.  Right Ear: External ear normal.  Left Ear: External ear normal.  Mouth/Throat: Oropharynx is clear and moist. No oropharyngeal exudate.  No drooling or stridor. Posterior pharynx mildly erythematous no significant tonsillar hypertrophy. No exudate. Soft palate rises  symmetrically. No TTP or induration under tongue.   No tenderness to palpation of frontal or bilateral maxillary sinuses.  Mild mucosal edema in the nares with scant rhinorrhea.  Bilateral tympanic membranes with normal architecture and good light reflex.    Eyes: Conjunctivae and EOM are normal. Pupils are equal, round, and reactive to light.  Neck: Normal range of motion. Neck supple.  FROM to C-spine. Pt can touch chin to chest without discomfort. No TTP of midline cervical spine.   Cardiovascular: Normal rate, regular rhythm and intact distal pulses.  Pulmonary/Chest: Effort normal and breath sounds normal. No stridor. No  respiratory distress. He has no wheezes. He has no rales. He exhibits no tenderness.  Slightly reduced air movement especially at the bases  Abdominal: Soft. There is no tenderness. There is no rebound and no guarding.  Musculoskeletal: Normal range of motion.  Neurological: He is alert and oriented to person, place, and time. No cranial nerve deficit.  Skin: He is not diaphoretic.  Psychiatric: He has a normal mood and affect.  Nursing note and vitals reviewed.    ED Treatments / Results  Labs (all labs ordered are listed, but only abnormal results are displayed) Labs Reviewed  COMPREHENSIVE METABOLIC PANEL - Abnormal; Notable for the following components:      Result Value   Glucose, Bld 139 (*)    ALT 15 (*)    All other components within normal limits  CULTURE, BLOOD (ROUTINE X 2)  CULTURE, BLOOD (ROUTINE X 2)  URINE CULTURE  CBC WITH DIFFERENTIAL/PLATELET  URINALYSIS, ROUTINE W REFLEX MICROSCOPIC  I-STAT CG4 LACTIC ACID, ED  I-STAT CG4 LACTIC ACID, ED    EKG  EKG Interpretation None       Radiology Dg Chest 2 View  Result Date: 09/18/2017 CLINICAL DATA:  Productive cough for 3 days.  Labored breathing. EXAM: CHEST  2 VIEW COMPARISON:  None. FINDINGS: The lungs are hyperinflated likely secondary to COPD. There is no focal parenchymal opacity. There is blunting of the right costophrenic angle which may reflect a trace pleural effusion versus scarring. There is no left pleural effusion. There is no pneumothorax. The heart and mediastinal contours are unremarkable. The osseous structures are unremarkable. IMPRESSION: Blunting of the right costophrenic angle which may reflect a trace pleural effusion versus scarring. Otherwise no acute cardiopulmonary disease. COPD. Electronically Signed   By: Elige KoHetal  Patel   On: 09/18/2017 15:21    Procedures Procedures (including critical care time)  Medications Ordered in ED Medications  albuterol (PROVENTIL HFA;VENTOLIN HFA) 108 (90  Base) MCG/ACT inhaler 2 puff (not administered)  acetaminophen (TYLENOL) tablet 650 mg (650 mg Oral Given 09/18/17 1445)  sodium chloride 0.9 % bolus 1,000 mL (0 mLs Intravenous Stopped 09/18/17 1708)  ipratropium-albuterol (DUONEB) 0.5-2.5 (3) MG/3ML nebulizer solution 3 mL (3 mLs Nebulization Given 09/18/17 1612)  morphine 4 MG/ML injection 4 mg (4 mg Intravenous Given 09/18/17 1612)  ondansetron (ZOFRAN) injection 4 mg (4 mg Intravenous Given 09/18/17 1612)  predniSONE (DELTASONE) tablet 60 mg (60 mg Oral Given 09/18/17 1612)  ketorolac (TORADOL) 30 MG/ML injection 15 mg (15 mg Intravenous Given 09/18/17 1717)     Initial Impression / Assessment and Plan / ED Course  I have reviewed the triage vital signs and the nursing notes.  Pertinent labs & imaging results that were available during my care of the patient were reviewed by me and considered in my medical decision making (see chart for details).     Vitals:  09/18/17 1600 09/18/17 1630 09/18/17 1700 09/18/17 1803  BP: 120/79 124/74 133/65 120/70  Pulse:  72 73 70  Resp:    18  Temp:      TempSrc:      SpO2:  96%  99%  Weight:      Height:        Medications  albuterol (PROVENTIL HFA;VENTOLIN HFA) 108 (90 Base) MCG/ACT inhaler 2 puff (not administered)  acetaminophen (TYLENOL) tablet 650 mg (650 mg Oral Given 09/18/17 1445)  sodium chloride 0.9 % bolus 1,000 mL (0 mLs Intravenous Stopped 09/18/17 1708)  ipratropium-albuterol (DUONEB) 0.5-2.5 (3) MG/3ML nebulizer solution 3 mL (3 mLs Nebulization Given 09/18/17 1612)  morphine 4 MG/ML injection 4 mg (4 mg Intravenous Given 09/18/17 1612)  ondansetron (ZOFRAN) injection 4 mg (4 mg Intravenous Given 09/18/17 1612)  predniSONE (DELTASONE) tablet 60 mg (60 mg Oral Given 09/18/17 1612)  ketorolac (TORADOL) 30 MG/ML injection 15 mg (15 mg Intravenous Given 09/18/17 1717)    Travis Heath is 57 y.o. male presenting with fever, myalgia, upper respiratory symptoms in addition to productive  cough.  Patient nontoxic-appearing, initially found to be febrile to 102, no tachycardia or tachypnea.  Chest x-ray with possible trace right pleural effusion versus scarring.  Blood work reassuring with no significant leukocytosis, negative lactic acid, blood cultures pending, urinalysis without signs of infection.  This is likely influenza.  He is outside the 48-hour window for Tamiflu.  Slightly reduced air movement, will give DuoNeb.  Chest x-ray is consistent with emphysema, he was not aware of this.  We have had an extensive discussion on smoking cessation.  Patient given fluid bolus in the ED, patient reports improvement in symptoms.  Will follow closely with primary care and pulmonology for evaluation of his COPD.  Cover for COPD exacerbation with doxycycline as well.  Extensive discussion of return precautions and patient verbalized understanding and teach back technique.  Evaluation does not show pathology that would require ongoing emergent intervention or inpatient treatment. Pt is hemodynamically stable and mentating appropriately. Discussed findings and plan with patient/guardian, who agrees with care plan. All questions answered. Return precautions discussed and outpatient follow up given.      Final Clinical Impressions(s) / ED Diagnoses   Final diagnoses:  Influenza-like illness  Chronic bronchitis, unspecified chronic bronchitis type [facility])    ED Discharge Orders        Ordered    HYDROcodone-acetaminophen (NORCO/VICODIN) 5-325 MG tablet     09/18/17 1834    doxycycline (VIBRAMYCIN) 100 MG capsule  2 times daily     09/18/17 1839       Brazen Domangue, Mardella Layman 09/18/17 1836    Andru Genter, Joni Reining, PA-C 09/18/17 1839    Arby Barrette, MD 09/23/17 1732

## 2017-09-18 NOTE — ED Triage Notes (Addendum)
Per Pt, Pt is coming with complaints of cough, body aches x 3 days.   Pt has hx of neck arthritis and pain. Reports that he had the cough first and then started to have the neck pain secondary consistent with what the patient has had in the past.

## 2017-09-18 NOTE — ED Notes (Signed)
Called pt x2 for triage, no response. 

## 2017-09-19 LAB — URINE CULTURE: CULTURE: NO GROWTH

## 2017-09-23 LAB — CULTURE, BLOOD (ROUTINE X 2)
CULTURE: NO GROWTH
Culture: NO GROWTH
SPECIAL REQUESTS: ADEQUATE
Special Requests: ADEQUATE

## 2020-10-22 ENCOUNTER — Other Ambulatory Visit: Payer: Self-pay

## 2020-10-22 ENCOUNTER — Encounter (HOSPITAL_COMMUNITY): Payer: Self-pay | Admitting: Emergency Medicine

## 2020-10-22 ENCOUNTER — Emergency Department (HOSPITAL_COMMUNITY)
Admission: EM | Admit: 2020-10-22 | Discharge: 2020-10-23 | Disposition: A | Discharge status: Home / Self Care | Payer: Self-pay | Attending: Emergency Medicine | Admitting: Emergency Medicine

## 2020-10-22 DIAGNOSIS — R1084 Generalized abdominal pain: Secondary | ICD-10-CM | POA: Insufficient documentation

## 2020-10-22 DIAGNOSIS — R112 Nausea with vomiting, unspecified: Secondary | ICD-10-CM

## 2020-10-22 DIAGNOSIS — F17210 Nicotine dependence, cigarettes, uncomplicated: Secondary | ICD-10-CM | POA: Insufficient documentation

## 2020-10-22 DIAGNOSIS — R197 Diarrhea, unspecified: Secondary | ICD-10-CM | POA: Insufficient documentation

## 2020-10-22 LAB — CBC
HCT: 35.7 % — ABNORMAL LOW (ref 39.0–52.0)
Hemoglobin: 11.1 g/dL — ABNORMAL LOW (ref 13.0–17.0)
MCH: 24.1 pg — ABNORMAL LOW (ref 26.0–34.0)
MCHC: 31.1 g/dL (ref 30.0–36.0)
MCV: 77.6 fL — ABNORMAL LOW (ref 80.0–100.0)
Platelets: 350 10*3/uL (ref 150–400)
RBC: 4.6 MIL/uL (ref 4.22–5.81)
RDW: 18.4 % — ABNORMAL HIGH (ref 11.5–15.5)
WBC: 7.3 10*3/uL (ref 4.0–10.5)
nRBC: 0 % (ref 0.0–0.2)

## 2020-10-22 LAB — COMPREHENSIVE METABOLIC PANEL
ALT: 12 U/L (ref 0–44)
AST: 16 U/L (ref 15–41)
Albumin: 3.8 g/dL (ref 3.5–5.0)
Alkaline Phosphatase: 55 U/L (ref 38–126)
Anion gap: 10 (ref 5–15)
BUN: 13 mg/dL (ref 6–20)
CO2: 24 mmol/L (ref 22–32)
Calcium: 9.3 mg/dL (ref 8.9–10.3)
Chloride: 103 mmol/L (ref 98–111)
Creatinine, Ser: 0.95 mg/dL (ref 0.61–1.24)
GFR, Estimated: >60 mL/min (ref >60)
Glucose, Bld: 109 mg/dL — ABNORMAL HIGH (ref 70–99)
Potassium: 3.9 mmol/L (ref 3.5–5.1)
Sodium: 137 mmol/L (ref 135–145)
Total Bilirubin: 0.6 mg/dL (ref 0.3–1.2)
Total Protein: 7.2 g/dL (ref 6.5–8.1)

## 2020-10-22 LAB — URINALYSIS, ROUTINE W REFLEX MICROSCOPIC
Bilirubin Urine: NEGATIVE
Glucose, UA: NEGATIVE mg/dL
Hgb urine dipstick: NEGATIVE
Ketones, ur: NEGATIVE mg/dL
Leukocytes,Ua: NEGATIVE
Nitrite: NEGATIVE
Protein, ur: NEGATIVE mg/dL
Specific Gravity, Urine: 1.027 (ref 1.005–1.030)
pH: 6 (ref 5.0–8.0)

## 2020-10-22 LAB — LIPASE, BLOOD: Lipase: 33 U/L (ref 11–51)

## 2020-10-22 NOTE — ED Triage Notes (Signed)
Pt BIB [facility], c/o abdominal pain, nausea/vomiting after eating and drinking water from the tap of the [place] where he lives.

## 2020-10-23 MED ORDER — ONDANSETRON HCL 4 MG/2ML IJ SOLN
4.0000 mg | Freq: Once | INTRAMUSCULAR | Status: AC
  Administered 2020-10-23: 4 mg via INTRAVENOUS
  Filled 2020-10-23: qty 2

## 2020-10-23 MED ORDER — SODIUM CHLORIDE 0.9 % IV BOLUS
1000.0000 mL | Freq: Once | INTRAVENOUS | Status: AC
  Administered 2020-10-23: 1000 mL via INTRAVENOUS

## 2020-10-23 MED ORDER — ONDANSETRON HCL 4 MG PO TABS: 4.0000 mg | ORAL_TABLET | Freq: Three times a day (TID) | ORAL | 0 refills | Status: DC | PRN

## 2020-10-23 MED ORDER — DICYCLOMINE HCL 10 MG/ML IM SOLN
20.0000 mg | Freq: Once | INTRAMUSCULAR | Status: DC
  Filled 2020-10-23: qty 2

## 2020-10-23 NOTE — Discharge Instructions (Addendum)
1. Medications: zofran, usual home medications °2. Treatment: rest, drink plenty of fluids, advance diet slowly °3. Follow Up: Please followup with your primary doctor in 2 days for discussion of your diagnoses and further evaluation after today's visit; if you do not have a primary care doctor use the resource guide provided to find one; Please return to the ER for persistent vomiting, high fevers or worsening symptoms ° °

## 2020-10-23 NOTE — ED Notes (Signed)
Pt sleeping sats good  Pt very hard of hearing

## 2020-10-23 NOTE — ED Notes (Signed)
The pt reports that he is not mauseated and he has no pain  He wants something to eat  Probably the reason he came here

## 2020-10-23 NOTE — ED Provider Notes (Signed)
[facility] EMERGENCY DEPARTMENT Provider Note   CSN: [id number] Arrival date & time: 10/22/20  2223     History Chief Complaint  Patient presents with  . Abdominal Pain    Travis Heath is a 60 y.o. male presents to the Emergency Department complaining of gradual, persistent, progressively worsening nausea vomiting and diarrhea onset approximately 12 hours ago.  Patient thinks that he may have a gastrointestinal virus or he may have food poisoning from drinking the water in the motel where he [place].  He reports associated generalized abdominal pain described as cramping and rated at a 5/10.  He reports this increases in intensity before he has diarrhea and is improved after defecation.  He reports his abdomen is generally sore from vomiting.  He denies bilious or bloody emesis.  No melena or hematochezia.  No known sick contacts.  Patient denies fever, chills, headache, neck pain, chest pain, syncope, dysuria, hematuria.  No CP, SOB.  No hx of CAD.  The history is provided by the patient and medical records. No language interpreter was used.       Past Medical History:  Diagnosis Date  . Arthritis     There are no problems to display for this patient.   Past Surgical History:  Procedure Laterality Date  . FRACTURE SURGERY     facial        No family history on file.  Social History   Tobacco Use  . Smoking status: Current Every Day Smoker    Packs/day: 0.50    Types: Cigarettes  . Smokeless tobacco: Never Used  Substance Use Topics  . Drug use: No    Home Medications Prior to Admission medications   Medication Sig Start Date End Date Taking? Authorizing Provider  ondansetron (ZOFRAN) 4 MG tablet Take 1 tablet (4 mg total) by mouth every 8 (eight) hours as needed for nausea or vomiting. 10/23/20  Yes Muthersbaugh, Dahlia Client, PA-C  acetaminophen (TYLENOL) 500 MG tablet Take 500 mg by mouth every 6 (six) hours as needed for moderate pain.    [provider]  doxycycline (VIBRAMYCIN) 100 MG capsule Take 1 capsule (100 mg total) by mouth 2 (two) times daily. 09/18/17   Pisciotta, Joni Reining, PA-C  HYDROcodone-acetaminophen (NORCO/VICODIN) 5-325 MG tablet Take 1-2 tablets by mouth every 6 hours as needed for pain and/or cough. 09/18/17   Pisciotta, Joni Reining, PA-C  omeprazole (PRILOSEC) 20 MG capsule Take 1 capsule (20 mg total) by mouth daily. 12/31/15   Everlene Farrier, PA-C    Allergies    Patient has no known allergies.  Review of Systems   Review of Systems  Constitutional: Negative for appetite change, diaphoresis, fatigue, fever and unexpected weight change.  HENT: Negative for mouth sores.   Eyes: Negative for visual disturbance.  Respiratory: Negative for cough, chest tightness, shortness of breath and wheezing.   Cardiovascular: Negative for chest pain.  Gastrointestinal: Positive for abdominal pain, diarrhea, nausea and vomiting. Negative for constipation.  Endocrine: Negative for polydipsia, polyphagia and polyuria.  Genitourinary: Negative for dysuria, frequency, hematuria and urgency.  Musculoskeletal: Negative for back pain and neck stiffness.  Skin: Negative for rash.  Allergic/Immunologic: Negative for immunocompromised state.  Neurological: Negative for syncope, light-headedness and headaches.  Hematological: Does not bruise/bleed easily.  Psychiatric/Behavioral: Negative for sleep disturbance. The patient is not nervous/anxious.     Physical Exam Updated Vital Signs BP 126/78 (BP Location: Left Arm)   Pulse 73   Temp 98.5 F (36.9 C)  Resp 18   SpO2 96%   Physical Exam Vitals and nursing note reviewed.  Constitutional:      General: He is not in acute distress.    Appearance: He is not diaphoretic.  HENT:     Head: Normocephalic.  Eyes:     General: No scleral icterus.    Conjunctiva/sclera: Conjunctivae normal.  Cardiovascular:     Rate and Rhythm: Normal rate and regular rhythm.     Pulses: Normal  pulses.          Radial pulses are 2+ on the right side and 2+ on the left side.  Pulmonary:     Effort: No tachypnea, accessory muscle usage, prolonged expiration, respiratory distress or retractions.     Breath sounds: No stridor.     Comments: Equal chest rise. No increased work of breathing. Abdominal:     General: There is no distension.     Palpations: Abdomen is soft.     Tenderness: There is generalized abdominal tenderness. There is no right CVA tenderness, left CVA tenderness, guarding or rebound.  Musculoskeletal:     Cervical back: Normal range of motion.     Comments: Moves all extremities equally and without difficulty.  Skin:    General: Skin is warm and dry.     Capillary Refill: Capillary refill takes less than 2 seconds.  Neurological:     Mental Status: He is alert.     GCS: GCS eye subscore is 4. GCS verbal subscore is 5. GCS motor subscore is 6.     Comments: Speech is clear and goal oriented.  Psychiatric:        Mood and Affect: Mood normal.     ED Results / Procedures / Treatments   Labs (all labs ordered are listed, but only abnormal results are displayed) Labs Reviewed  COMPREHENSIVE METABOLIC PANEL - Abnormal; Notable for the following components:      Result Value   Glucose, Bld 109 (*)    All other components within normal limits  CBC - Abnormal; Notable for the following components:   Hemoglobin 11.1 (*)    HCT 35.7 (*)    MCV 77.6 (*)    MCH 24.1 (*)    RDW 18.4 (*)    All other components within normal limits  LIPASE, BLOOD  URINALYSIS, ROUTINE W REFLEX MICROSCOPIC     Procedures Procedures   Medications Ordered in ED Medications  dicyclomine (BENTYL) injection 20 mg (has no administration in time range)  sodium chloride 0.9 % bolus 1,000 mL (1,000 mLs Intravenous New Bag/Given 10/23/20 0404)  ondansetron (ZOFRAN) injection 4 mg (4 mg Intravenous Given 10/23/20 0405)    ED Course  I have reviewed the triage vital signs and the  nursing notes.  Pertinent labs & imaging results that were available during my care of the patient were reviewed by me and considered in my medical decision making (see chart for details).    MDM Rules/Calculators/A&P                           Patient presents with abdominal pain, nausea and vomiting.  Well-appearing with mucous membranes.  SPECT gastroenteritis versus food poisoning.  Less likely to be diverticulitis, appendicitis, colitis.  Highly doubt small bowel obstruction.  No imaging indicated at this time.  Labs are reassuring.  Patient with mild anemia however has had the same before.  Urinalysis that evidence of urinary tract infection or severe  dehydration.  Patient given fluids, Zofran here in the emergency department with improvement in symptoms.  He is urinating without difficulty.  Has tolerated p.o.  Repeat abdominal exam remains benign.  Will be discharged home with prescription for Zofran and close follow-up with PCP.  Discussed reasons to return to the emergency department.  BP 126/78 (BP Location: Left Arm)   Pulse 73   Temp 98.5 F (36.9 C)   Resp 18   SpO2 96%     Final Clinical Impression(s) / ED Diagnoses Final diagnoses:  None    Rx / DC Orders ED Discharge Orders         Ordered    ondansetron (ZOFRAN) 4 MG tablet  Every 8 hours PRN        10/23/20 0451           Muthersbaugh, Boyd Kerbs 10/23/20 2979    Zadie Rhine, MD 10/23/20 [phone]

## 2020-10-23 NOTE — ED Notes (Signed)
The pt appears more rested  At present water given nasal 02 at 3  sats in the high 90s

## 2021-01-11 ENCOUNTER — Other Ambulatory Visit: Payer: Self-pay

## 2021-01-11 ENCOUNTER — Emergency Department (HOSPITAL_COMMUNITY): Payer: Self-pay

## 2021-01-11 ENCOUNTER — Encounter (HOSPITAL_COMMUNITY): Payer: Self-pay | Admitting: Pharmacy Technician

## 2021-01-11 ENCOUNTER — Emergency Department (HOSPITAL_COMMUNITY)
Admission: EM | Admit: 2021-01-11 | Discharge: 2021-01-11 | Disposition: A | Discharge status: Home / Self Care | Payer: Self-pay | Attending: Emergency Medicine | Admitting: Emergency Medicine

## 2021-01-11 DIAGNOSIS — Y93G1 Activity, food preparation and clean up: Secondary | ICD-10-CM | POA: Insufficient documentation

## 2021-01-11 DIAGNOSIS — Y92511 Restaurant or cafe as the place of occurrence of the external cause: Secondary | ICD-10-CM | POA: Insufficient documentation

## 2021-01-11 DIAGNOSIS — F17210 Nicotine dependence, cigarettes, uncomplicated: Secondary | ICD-10-CM | POA: Insufficient documentation

## 2021-01-11 DIAGNOSIS — X501XXA Overexertion from prolonged static or awkward postures, initial encounter: Secondary | ICD-10-CM

## 2021-01-11 DIAGNOSIS — M47896 Other spondylosis, lumbar region: Secondary | ICD-10-CM | POA: Insufficient documentation

## 2021-01-11 DIAGNOSIS — M47816 Spondylosis without myelopathy or radiculopathy, lumbar region: Secondary | ICD-10-CM

## 2021-01-11 DIAGNOSIS — W010XXA Fall on same level from slipping, tripping and stumbling without subsequent striking against object, initial encounter: Secondary | ICD-10-CM | POA: Insufficient documentation

## 2021-01-11 MED ORDER — KETOROLAC TROMETHAMINE 10 MG PO TABS
10.0000 mg | ORAL_TABLET | Freq: Once | ORAL | Status: AC
  Administered 2021-01-11: 10 mg via ORAL
  Filled 2021-01-11: qty 1

## 2021-01-11 NOTE — ED Triage Notes (Signed)
Pt here with reports of chronic back pain. Pt states his back worsened over the last 3-4 days. Pt reports twisting his back at work and since then his back has been worse. Pt reports pain wraps around into his groin. Denies loss of bowel/bladder.

## 2021-01-11 NOTE — ED Provider Notes (Signed)
[name] EMERGENCY DEPARTMENT Provider Note   CSN: [id number] Arrival date & time: 01/11/21  1522     History Chief Complaint  Patient presents with  . Back Pain    Travis Heath is a 60 y.o. male.  Travis Heath has had back pain chronically.  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  He slipped on some water, and one of his legs flew out to the side.  This caused him to abruptly twist his back, and ever since then he has had some low back pain.  He says that he mostly wants to know what is wrong, and he also wants to know whether or not this is going to be a chronic issue.  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  The history is provided by the patient.  Back Pain Location:  Lumbar spine Quality:  Shooting Radiates to:  L thigh and R thigh Pain severity:  Severe Pain is:  Same all the time Onset quality:  Sudden Timing:  Constant Progression:  Waxing and waning Chronicity:  Recurrent Context: twisting   Relieved by:  Being still Worsened by:  Twisting and movement Ineffective treatments:  OTC medications Associated symptoms: no abdominal pain, no bladder incontinence, no bowel incontinence, no chest pain, no dysuria, no fever, no paresthesias, no perianal numbness, no tingling and no weakness        Past Medical History:  Diagnosis Date  . Arthritis     There are no problems to display for this patient.   Past Surgical History:  Procedure Laterality Date  . FRACTURE SURGERY     facial        No family history on file.  Social History   Tobacco Use  . Smoking status: Current Every Day Smoker    Packs/day: 0.50    Types: Cigarettes  . Smokeless tobacco: Never Used  Substance Use Topics  . Drug  use: No    Home Medications Prior to Admission medications   Medication Sig Start Date End Date Taking? Authorizing Provider  acetaminophen (TYLENOL) 500 MG tablet Take 500 mg by mouth every 6 (six) hours as needed for moderate pain.    [provider]  doxycycline (VIBRAMYCIN) 100 MG capsule Take 1 capsule (100 mg total) by mouth 2 (two) times daily. 09/18/17   Pisciotta, Joni Reining, PA-C  HYDROcodone-acetaminophen (NORCO/VICODIN) 5-325 MG tablet Take 1-2 tablets by mouth every 6 hours as needed for pain and/or cough. 09/18/17   Pisciotta, Joni Reining, PA-C  omeprazole (PRILOSEC) 20 MG capsule Take 1 capsule (20 mg total) by mouth daily. 12/31/15   Everlene Farrier, PA-C  ondansetron (ZOFRAN) 4 MG tablet Take 1 tablet (4 mg total) by mouth every 8 (eight) hours as needed for nausea or vomiting. 10/23/20   Muthersbaugh, Dahlia Client, PA-C    Allergies    Patient has no known allergies.  Review of Systems   Review of Systems  Constitutional: Negative for chills and fever.  HENT: Negative for ear pain and sore throat.   Eyes: Negative for pain and visual disturbance.  Respiratory: Negative for cough and shortness of breath.   Cardiovascular: Negative for chest pain and palpitations.  Gastrointestinal: Negative for abdominal pain, bowel incontinence and  vomiting.  Genitourinary: Negative for bladder incontinence, dysuria and hematuria.  Musculoskeletal: Positive for back pain. Negative for arthralgias.  Skin: Negative for color change and rash.  Neurological: Negative for tingling, seizures, syncope, weakness and paresthesias.  All other systems reviewed and are negative.   Physical Exam Updated Vital Signs BP 111/78   Pulse (!) 55   Temp 98.9 F (37.2 C) (Oral)   Resp 20   SpO2 100%   Physical Exam Vitals and nursing note reviewed.  Constitutional:      Appearance: Normal appearance.  HENT:     Head: Normocephalic and atraumatic.  Eyes:     Conjunctiva/sclera: Conjunctivae normal.   Pulmonary:     Effort: Pulmonary effort is normal. No respiratory distress.  Musculoskeletal:        General: No deformity.     Cervical back: Normal range of motion.     Lumbar back: Tenderness present. Decreased range of motion.       Back:     Comments: Diffuse tenderness about the entire lumbar spine.  Flexion is painful and somewhat limited.  Extension is not limited.  Hip range of motion normal  Skin:    General: Skin is warm and dry.  Neurological:     General: No focal deficit present.     Mental Status: He is alert and oriented to person, place, and time. Mental status is at baseline.     Comments: Gait is normal including heel and toe walking.  Normal strength and sensation in the lower extremities.  Psychiatric:        Mood and Affect: Mood normal.     ED Results / Procedures / Treatments   Labs (all labs ordered are listed, but only abnormal results are displayed) Labs Reviewed - No data to display  EKG None  Radiology DG Lumbar Spine Complete  Result Date: 01/11/2021 CLINICAL DATA:  low back pain, twisting injury EXAM: LUMBAR SPINE - COMPLETE 4+ VIEW COMPARISON:  Jan 06, 2019. FINDINGS: Five lumbar type vertebral bodies. There is no evidence of lumbar spine fracture. Alignment is normal. Similar mild multilevel vertebral body degenerative height loss and moderate facet hypertrophy. IMPRESSION: 1. No acute osseous abnormality. 2. Similar mild-to-moderate lumbar spondylosis. Electronically Signed   By: Maudry Mayhew MD   On: 01/11/2021 22:26   DG HIP UNILAT WITH PELVIS 2-3 VIEWS LEFT  Result Date: 01/11/2021 EXAM: DG HIP (WITH OR WITHOUT PELVIS) 2-3V LEFT COMPARISON:  None. FINDINGS: There is no evidence of hip fracture or dislocation. There is no evidence of significant arthropathy or other focal bone abnormality. IMPRESSION: Negative. Electronically Signed   By: Maudry Mayhew MD   On: 01/11/2021 22:28   DG HIP UNILAT WITH PELVIS 2-3 VIEWS RIGHT  Result Date:  01/11/2021 CLINICAL DATA:  low back pain, twisting injury EXAM: DG HIP (WITH OR WITHOUT PELVIS) 2-3V RIGHT COMPARISON:  None. FINDINGS: There is no evidence of hip fracture or dislocation. There is no evidence of significant arthropathy or other focal bone abnormality. IMPRESSION: Negative. Electronically Signed   By: Maudry Mayhew MD   On: 01/11/2021 22:28    Procedures Procedures   Medications Ordered in ED Medications  ketorolac (TORADOL) tablet 10 mg (has no administration in time range)    ED Course  I have reviewed the triage vital signs and the nursing notes.  Pertinent labs & imaging results that were available during my care of the patient were reviewed by me and considered in my medical decision making (  see chart for details).    MDM Rules/Calculators/A&P                          Ozzie Knobel presents with low back pain that is acute on chronic in nature.  He has no neurologic signs or symptoms.  X-rays reveal no bony lytic lesions or severe pathology.  He has some moderate arthritis which is in keeping with his history of intermittent low back pain.  He was given information on symptomatic management, and return precautions were discussed. Final Clinical Impression(s) / ED Diagnoses Final diagnoses:  Lumbar spondylosis    Rx / DC Orders ED Discharge Orders    None       Koleen Distance, MD 01/11/21 2236

## 2021-01-15 ENCOUNTER — Encounter: Payer: Self-pay | Admitting: *Deleted

## 2021-01-15 NOTE — Congregational Nurse Program (Signed)
  Dept: [phone]   Congregational Nurse Program Note  Date of Encounter: 01/15/2021  Past Medical History: Past Medical History:  Diagnosis Date  . Arthritis     Encounter Details:  CNP Questionnaire - 01/15/21 1407      Questionnaire   Do you give verbal consent to treat you today? Yes    Visit Setting Church or Organization    Location Patient Served At [facility]    Patient Status Homeless    Medical Provider Yes    Insurance Uninsured (Includes [employer]/Care [place])    Intervention Refer    Referrals PCP - other provider   Lavinia Sharps NP         [facility]. He requested help getting his medications and thought they were in his discharge. Checked papers and could see where no prescriptions were given. Referred to Lavinia Sharps NP and escorted client to her office for appt.  [place] W RN CN [phone]

## 2021-08-19 ENCOUNTER — Other Ambulatory Visit: Payer: Self-pay

## 2021-08-19 ENCOUNTER — Inpatient Hospital Stay (HOSPITAL_COMMUNITY)
Admission: EM | Admit: 2021-08-19 | Discharge: 2021-08-20 | DRG: 812 | Disposition: A | Discharge status: Home / Self Care | Payer: Self-pay | Attending: Osteopathic Medicine | Admitting: Osteopathic Medicine

## 2021-08-19 ENCOUNTER — Observation Stay (HOSPITAL_COMMUNITY): Payer: Self-pay

## 2021-08-19 DIAGNOSIS — R040 Epistaxis: Secondary | ICD-10-CM | POA: Diagnosis present

## 2021-08-19 DIAGNOSIS — R195 Other fecal abnormalities: Secondary | ICD-10-CM

## 2021-08-19 DIAGNOSIS — F1910 Other psychoactive substance abuse, uncomplicated: Secondary | ICD-10-CM

## 2021-08-19 DIAGNOSIS — I959 Hypotension, unspecified: Secondary | ICD-10-CM | POA: Diagnosis present

## 2021-08-19 DIAGNOSIS — F1410 Cocaine abuse, uncomplicated: Secondary | ICD-10-CM | POA: Diagnosis present

## 2021-08-19 DIAGNOSIS — Z5900 Homelessness unspecified: Secondary | ICD-10-CM

## 2021-08-19 DIAGNOSIS — Z79899 Other long term (current) drug therapy: Secondary | ICD-10-CM

## 2021-08-19 DIAGNOSIS — D62 Acute posthemorrhagic anemia: Principal | ICD-10-CM | POA: Diagnosis present

## 2021-08-19 DIAGNOSIS — F17210 Nicotine dependence, cigarettes, uncomplicated: Secondary | ICD-10-CM | POA: Diagnosis present

## 2021-08-19 DIAGNOSIS — D649 Anemia, unspecified: Secondary | ICD-10-CM

## 2021-08-19 DIAGNOSIS — Z20822 Contact with and (suspected) exposure to covid-19: Secondary | ICD-10-CM | POA: Diagnosis present

## 2021-08-19 DIAGNOSIS — D509 Iron deficiency anemia, unspecified: Secondary | ICD-10-CM | POA: Diagnosis present

## 2021-08-19 DIAGNOSIS — K920 Hematemesis: Secondary | ICD-10-CM | POA: Diagnosis present

## 2021-08-19 LAB — CBC
HCT: 22.5 % — ABNORMAL LOW (ref 39.0–52.0)
HCT: 21.6 % — ABNORMAL LOW (ref 39.0–52.0)
Hemoglobin: 6.7 g/dL — CL (ref 13.0–17.0)
Hemoglobin: 6.5 g/dL — CL (ref 13.0–17.0)
MCH: 23.5 pg — ABNORMAL LOW (ref 26.0–34.0)
MCH: 23.7 pg — ABNORMAL LOW (ref 26.0–34.0)
MCHC: 29.8 g/dL — ABNORMAL LOW (ref 30.0–36.0)
MCHC: 30.1 g/dL (ref 30.0–36.0)
MCV: 78.9 fL — ABNORMAL LOW (ref 80.0–100.0)
MCV: 78.8 fL — ABNORMAL LOW (ref 80.0–100.0)
Platelets: 287 10*3/uL (ref 150–400)
Platelets: 283 10*3/uL (ref 150–400)
RBC: 2.85 MIL/uL — ABNORMAL LOW (ref 4.22–5.81)
RBC: 2.74 MIL/uL — ABNORMAL LOW (ref 4.22–5.81)
RDW: 16.8 % — ABNORMAL HIGH (ref 11.5–15.5)
RDW: 16.9 % — ABNORMAL HIGH (ref 11.5–15.5)
WBC: 5.9 10*3/uL (ref 4.0–10.5)
WBC: 5.7 10*3/uL (ref 4.0–10.5)
nRBC: 0 % (ref 0.0–0.2)
nRBC: 0 % (ref 0.0–0.2)

## 2021-08-19 LAB — VITAMIN B12: Vitamin B-12: 188 pg/mL (ref 180–914)

## 2021-08-19 LAB — HEPATIC FUNCTION PANEL
ALT: 13 U/L (ref 0–44)
AST: 17 U/L (ref 15–41)
Albumin: 3 g/dL — ABNORMAL LOW (ref 3.5–5.0)
Alkaline Phosphatase: 40 U/L (ref 38–126)
Bilirubin, Direct: <0.1 mg/dL (ref 0.0–0.2)
Total Bilirubin: 0.3 mg/dL (ref 0.3–1.2)
Total Protein: 5.7 g/dL — ABNORMAL LOW (ref 6.5–8.1)

## 2021-08-19 LAB — BASIC METABOLIC PANEL
Anion gap: 4 — ABNORMAL LOW (ref 5–15)
BUN: 17 mg/dL (ref 8–23)
CO2: 26 mmol/L (ref 22–32)
Calcium: 8.4 mg/dL — ABNORMAL LOW (ref 8.9–10.3)
Chloride: 108 mmol/L (ref 98–111)
Creatinine, Ser: 0.89 mg/dL (ref 0.61–1.24)
GFR, Estimated: >60 mL/min (ref >60)
Glucose, Bld: 104 mg/dL — ABNORMAL HIGH (ref 70–99)
Potassium: 4.7 mmol/L (ref 3.5–5.1)
Sodium: 138 mmol/L (ref 135–145)

## 2021-08-19 LAB — ABO/RH: ABO/RH(D): A POS

## 2021-08-19 LAB — IRON AND TIBC
Iron: 12 ug/dL — ABNORMAL LOW (ref 45–182)
Saturation Ratios: 3 % — ABNORMAL LOW (ref 17.9–39.5)
TIBC: 424 ug/dL (ref 250–450)
UIBC: 412 ug/dL

## 2021-08-19 LAB — RESP PANEL BY RT-PCR (FLU A&B, COVID) ARPGX2
Influenza A by PCR: NEGATIVE
Influenza B by PCR: NEGATIVE
SARS Coronavirus 2 by RT PCR: NEGATIVE

## 2021-08-19 LAB — HEMOGLOBIN AND HEMATOCRIT, BLOOD
HCT: 24.8 % — ABNORMAL LOW (ref 39.0–52.0)
Hemoglobin: 8 g/dL — ABNORMAL LOW (ref 13.0–17.0)

## 2021-08-19 LAB — FOLATE: Folate: 9 ng/mL (ref >5.9)

## 2021-08-19 LAB — SAMPLE TO BLOOD BANK

## 2021-08-19 LAB — PROTIME-INR
INR: 1.1 (ref 0.8–1.2)
Prothrombin Time: 13.8 seconds (ref 11.4–15.2)

## 2021-08-19 LAB — RETICULOCYTES
Immature Retic Fract: 35.1 % — ABNORMAL HIGH (ref 2.3–15.9)
RBC.: 2.84 MIL/uL — ABNORMAL LOW (ref 4.22–5.81)
Retic Count, Absolute: 40.9 10*3/uL (ref 19.0–186.0)
Retic Ct Pct: 1.4 % (ref 0.4–3.1)

## 2021-08-19 LAB — FERRITIN: Ferritin: 2 ng/mL — ABNORMAL LOW (ref 24–336)

## 2021-08-19 LAB — POC OCCULT BLOOD, ED: Fecal Occult Bld: POSITIVE — AB

## 2021-08-19 LAB — HIV ANTIBODY (ROUTINE TESTING W REFLEX): HIV Screen 4th Generation wRfx: NONREACTIVE

## 2021-08-19 LAB — TSH: TSH: 0.878 u[IU]/mL (ref 0.350–4.500)

## 2021-08-19 LAB — PREPARE RBC (CROSSMATCH)

## 2021-08-19 MED ORDER — SODIUM CHLORIDE 0.9 % IV SOLN
250.0000 mg | Freq: Every day | INTRAVENOUS | Status: AC
  Administered 2021-08-19 – 2021-08-20 (×2): 250 mg via INTRAVENOUS
  Filled 2021-08-19 (×2): qty 20

## 2021-08-19 MED ORDER — OXYMETAZOLINE HCL 0.05 % NA SOLN
1.0000 | Freq: Once | NASAL | Status: AC
  Administered 2021-08-19: 03:00:00 1 via NASAL
  Filled 2021-08-19: qty 30

## 2021-08-19 MED ORDER — ALBUTEROL SULFATE (2.5 MG/3ML) 0.083% IN NEBU: 2.5000 mg | INHALATION_SOLUTION | Freq: Four times a day (QID) | RESPIRATORY_TRACT | Status: DC | PRN

## 2021-08-19 MED ORDER — SODIUM CHLORIDE 0.9% FLUSH
3.0000 mL | Freq: Two times a day (BID) | INTRAVENOUS | Status: DC
  Administered 2021-08-19 – 2021-08-20 (×3): 3 mL via INTRAVENOUS

## 2021-08-19 MED ORDER — PANTOPRAZOLE SODIUM 40 MG PO TBEC
40.0000 mg | DELAYED_RELEASE_TABLET | Freq: Two times a day (BID) | ORAL | Status: DC
  Administered 2021-08-19 – 2021-08-20 (×2): 40 mg via ORAL
  Filled 2021-08-19 (×2): qty 1

## 2021-08-19 MED ORDER — LORAZEPAM 2 MG/ML IJ SOLN: 0.5000 mg | Freq: Four times a day (QID) | INTRAMUSCULAR | Status: DC | PRN

## 2021-08-19 MED ORDER — NICOTINE 21 MG/24HR TD PT24
21.0000 mg | MEDICATED_PATCH | Freq: Every day | TRANSDERMAL | Status: DC
  Administered 2021-08-19 – 2021-08-20 (×2): 21 mg via TRANSDERMAL
  Filled 2021-08-19 (×2): qty 1

## 2021-08-19 MED ORDER — ACETAMINOPHEN 650 MG RE SUPP: 650.0000 mg | Freq: Four times a day (QID) | RECTAL | Status: DC | PRN

## 2021-08-19 MED ORDER — ACETAMINOPHEN 325 MG PO TABS: 650.0000 mg | ORAL_TABLET | Freq: Four times a day (QID) | ORAL | Status: DC | PRN

## 2021-08-19 MED ORDER — SODIUM CHLORIDE 0.9 % IV SOLN
10.0000 mL/h | Freq: Once | INTRAVENOUS | Status: AC
  Administered 2021-08-19: 10 mL/h via INTRAVENOUS

## 2021-08-19 MED ORDER — NICOTINE 14 MG/24HR TD PT24: 14.0000 mg | MEDICATED_PATCH | Freq: Every day | TRANSDERMAL | Status: DC

## 2021-08-19 MED ORDER — PANTOPRAZOLE SODIUM 40 MG IV SOLR
40.0000 mg | Freq: Once | INTRAVENOUS | Status: AC
  Administered 2021-08-19: 40 mg via INTRAVENOUS
  Filled 2021-08-19: qty 40

## 2021-08-19 NOTE — ED Notes (Signed)
Pt signed blood consent at bedside  

## 2021-08-19 NOTE — ED Provider Notes (Signed)
[facility] EMERGENCY DEPARTMENT Provider Note   CSN: [id number] Arrival date & time: 08/19/21  0114     History Chief Complaint  Patient presents with   Epistaxis    Travis Heath is a 60 y.o. male.  60 year old male presents to the emergency department for evaluation of a nosebleed.  Reports nosebleed began spontaneously after blowing his nose tonight.  Continue to bleed for 1 hour before EMS was called.  EMS report 1 episode of bloody emesis during transport.  He was given 300 cc IV fluid.  His nosebleed has since stopped spontaneously.  Denies use of chronic anticoagulation.  Also denies history of alcohol abuse.   Epistaxis     Past Medical History:  Diagnosis Date   Arthritis     There are no problems to display for this patient.   Past Surgical History:  Procedure Laterality Date   FRACTURE SURGERY     facial        No family history on file.  Social History   Tobacco Use   Smoking status: Every Day    Packs/day: 0.50    Types: Cigarettes   Smokeless tobacco: Never  Substance Use Topics   Drug use: No    Home Medications Prior to Admission medications   Medication Sig Start Date End Date Taking? Authorizing Provider  HYDROcodone-acetaminophen (NORCO/VICODIN) 5-325 MG tablet Take 1-2 tablets by mouth every 6 hours as needed for pain and/or cough. Patient not taking: Reported on 08/19/2021 09/18/17   Pisciotta, Joni Reining, PA-C  omeprazole (PRILOSEC) 20 MG capsule Take 1 capsule (20 mg total) by mouth daily. Patient not taking: Reported on 08/19/2021 12/31/15   Everlene Farrier, PA-C  ondansetron (ZOFRAN) 4 MG tablet Take 1 tablet (4 mg total) by mouth every 8 (eight) hours as needed for nausea or vomiting. Patient not taking: Reported on 08/19/2021 10/23/20   Muthersbaugh, Dahlia Client, PA-C    Allergies    Patient has no known allergies.  Review of Systems   Review of Systems  HENT:  Positive for nosebleeds.   Ten systems reviewed and are  negative for acute change, except as noted in the HPI.    Physical Exam Updated Vital Signs BP 117/69    Pulse (!) 57    Temp (!) 97.4 F (36.3 C) (Oral)    Resp 17    SpO2 99%   Physical Exam Vitals and nursing note reviewed.  Constitutional:      General: He is not in acute distress.    Appearance: He is well-developed. He is not diaphoretic.     Comments: Nontoxic appearing. Sleeping.  HENT:     Head: Normocephalic and atraumatic.     Nose:     Comments: Bilateral nares patent. There is dried blood in the right nare. No active bleeding. No evidence of facial trauma. Eyes:     General: No scleral icterus.    Conjunctiva/sclera: Conjunctivae normal.  Cardiovascular:     Rate and Rhythm: Normal rate and regular rhythm.     Pulses: Normal pulses.  Pulmonary:     Effort: Pulmonary effort is normal. No respiratory distress.     Comments: Respirations even and unlabored Genitourinary:    Comments: DRE chaperoned by RN. Normal rectal tone. There is brown stool. No hematochezia, melena. Musculoskeletal:        General: Normal range of motion.     Cervical back: Normal range of motion.  Skin:    General: Skin is warm and dry.  Coloration: Skin is not pale.     Findings: No erythema or rash.  Neurological:     Mental Status: He is alert and oriented to person, place, and time.     Coordination: Coordination normal.  Psychiatric:        Behavior: Behavior normal.    ED Results / Procedures / Treatments   Labs (all labs ordered are listed, but only abnormal results are displayed) Labs Reviewed  CBC - Abnormal; Notable for the following components:      Result Value   RBC 2.85 (*)    Hemoglobin 6.7 (*)    HCT 22.5 (*)    MCV 78.9 (*)    MCH 23.5 (*)    MCHC 29.8 (*)    RDW 16.8 (*)    All other components within normal limits  CBC - Abnormal; Notable for the following components:   RBC 2.74 (*)    Hemoglobin 6.5 (*)    HCT 21.6 (*)    MCV 78.8 (*)    MCH 23.7 (*)     RDW 16.9 (*)    All other components within normal limits  POC OCCULT BLOOD, ED - Abnormal; Notable for the following components:   Fecal Occult Bld POSITIVE (*)    All other components within normal limits  RESP PANEL BY RT-PCR (FLU A&B, COVID) ARPGX2  PROTIME-INR  BASIC METABOLIC PANEL  SAMPLE TO BLOOD BANK  PREPARE RBC (CROSSMATCH)  ABO/RH  TYPE AND SCREEN    EKG None  Radiology No results found.  Procedures .Critical Care Performed by: Antony Madura, PA-C Authorized by: Antony Madura, PA-C   Critical care provider statement:    Critical care time (minutes):  30   Critical care was necessary to treat or prevent imminent or life-threatening deterioration of the following conditions:  Circulatory failure (anemia)   Critical care was time spent personally by me on the following activities:  Development of treatment plan with patient or surrogate, discussions with consultants, evaluation of patient's response to treatment, examination of patient, ordering and review of laboratory studies, ordering and review of radiographic studies, ordering and performing treatments and interventions, pulse oximetry, re-evaluation of patient's condition, review of old charts and obtaining history from patient or surrogate   I assumed direction of critical care for this patient from another provider in my specialty: no     Care discussed with: admitting provider     Medications Ordered in ED Medications  0.9 %  sodium chloride infusion (has no administration in time range)  oxymetazoline (AFRIN) 0.05 % nasal spray 1 spray (1 spray Each Nare Given 08/19/21 0249)    ED Course  I have reviewed the triage vital signs and the nursing notes.  Pertinent labs & imaging results that were available during my care of the patient were reviewed by me and considered in my medical decision making (see chart for details).  Clinical Course as of 08/19/21 0709  Sun Aug 19, 2021  1914 Patient with Hgb of  6.7. This is significantly lower than prior baseline in February. He is not seemingly symptomatic at this time. No SOB, DOE, pallor, tachycardia, hypotension. His profile is suggestive of a more chronic anemia. Do not feel that his Hgb is resultant of his nosebleed tonight. He continues to have no episodes of repeat bleeding in the ED. Denies increased bleeding/bruising, melena, hematochezia.  On exam, area of maceration noted to the medial anterior R nare. Nares remain patent. No blood in the posterior oropharynx. [  KH]  0456 Repeat Hgb confirmed at 6.5. This was a straight stick sample. [KH]  0537 Hemoccult positive for microscopic blood. Will initiate 1 unit PRBCs. Anticipate admission given social status with worsening anemia. [KH]    Clinical Course User Index [KH] Darylene Price   MDM Rules/Calculators/A&P                          60 year old male presents to the emergency department for right-sided epistaxis.  This resolved spontaneously prior to arrival.  He has not had any recurrence of epistaxis over ED course.  CBC was ordered to assess for thrombocytopenia given persistent bleeding prior to arrival.  Patient not chronically anticoagulated and denies history of alcohol abuse.  While platelets were reassuring, patient was found to have a worsening anemia with hemoglobin of 6.7.  This is significantly changed compared to February 2022.  Patient denies increased bleeding, bruising, melena, hematochezia.  While his Hemoccult is positive, he did not have any melena or hematochezia on digital rectal exam.  He has remained hemodynamically stable without tachycardia, hypotension.  Decision was made to proceed with transfusion of 1 unit PRBCs.  The patient is homeless.  He is not established with a primary care doctor.  I feel he would benefit from admission given social barriers in the setting of worsening anemia.  Pending callback from hospitalist for admission.  Care signed out to [place],  [place] at change of shift.     Final Clinical Impression(s) / ED Diagnoses Final diagnoses:  Anemia, unspecified type  Occult blood in stools  Right-sided epistaxis    Rx / DC Orders ED Discharge Orders     None        Antony Madura, PA-C 08/19/21 0709    Tilden Fossa, MD 08/20/21 [phone]

## 2021-08-19 NOTE — ED Triage Notes (Signed)
Pt bib [facility] c/o nose bleeding for over an hour. Pt began vomiting blood when transferring with EMS. EMS gave 300cc of fluid. Pt had large blood clots come from his nose. Pt alert upon arrival.   EMS vitals 108/72 60 HR 98 SpO2

## 2021-08-19 NOTE — ED Provider Notes (Signed)
°  Physical Exam  BP 117/69    Pulse (!) 57    Temp (!) 97.4 F (36.3 C) (Oral)    Resp 17    SpO2 99%   Physical Exam  ED Course/Procedures   Clinical Course as of 08/19/21 0727  Sun Aug 19, 2021  0712 Patient with Hgb of 6.7. This is significantly lower than prior baseline in February. He is not seemingly symptomatic at this time. No SOB, DOE, pallor, tachycardia, hypotension. His profile is suggestive of a more chronic anemia. Do not feel that his Hgb is resultant of his nosebleed tonight. He continues to have no episodes of repeat bleeding in the ED. Denies increased bleeding/bruising, melena, hematochezia.  On exam, area of maceration noted to the medial anterior R nare. Nares remain patent. No blood in the posterior oropharynx. [KH]  0456 Repeat Hgb confirmed at 6.5. This was a straight stick sample. [KH]  0537 Hemoccult positive for microscopic blood. Will initiate 1 unit PRBCs. Anticipate admission given social status with worsening anemia. [KH]    Clinical Course User Index [KH] Antony Madura, PA-C    Procedures  MDM  Accepted handoff at shift change from [facility]. Please see prior provider note for more detail.   Briefly: Patient is 60 y.o. presenting with nosebleed lasting for an hour earlier today. Hemoglobin 6.6 likely from a GI bleed, not from his epistaxis. Given limited resources, will give a unit of blood and admit to medicine.   DDX: concern for anemia.  Plan: Admit to medicine, awaiting call back.   Dr. Madilyn Hook spoke with Dr. Madelyn Flavors who will admit.          Achille Rich, PA-C 08/19/21 Jesusita Oka    Linwood Dibbles, MD 08/19/21 [phone]

## 2021-08-19 NOTE — ED Notes (Signed)
PA notified of repeat hgb

## 2021-08-19 NOTE — H&P (Addendum)
History and Physical    Travis Heath SNK:[id number] DOB: 26-May-1961 DOA: 08/19/2021  Referring MD/NP/PA: Tilden Fossa, MD PCP: Lavinia Sharps, NP  Patient coming from: via EMS  Chief Complaint: Nose bleed  I have personally briefly reviewed patient's old medical records in [facility]   HPI: Travis Heath is a 60 y.o. male with medical history significant of arthritis, tobacco abuse, and cocaine abuse presents after having acute onset of a right nostril nosebleed last night.  He denies doing anything in particular to onset symptoms.  Reports that it was bleeding very heavily for approximately an hour.  Notes that he was bleeding so much that he had swallowed a lot of blood as a result of the bleed which made him vomit all the swallowed blood.  After vomiting he felt a lot better as it was upsetting his stomach.  He reports that he is not on any medications and does not drink alcohol.  He does smoke on a regular basis but does not quantify and admits to use of cocaine.  His last use of cocaine was 5 days ago, but he states that he does not snore cocaine anymore and only smokes it.  States that he probably snorted cocaine when he was younger.  He denies using any NSAIDs, abdominal pain, or dark stools/blood in stools to his knowledge.  He has never had a colonoscopy.    ED Course: Upon admission into the emergency department patient was seen to be afebrile, pulse 57-62, respirations 15-17, blood pressure 98/48-117/69, and all other vital signs maintained. Labs significant for hemoglobin 6.5, MCV 78.8, and RDW 16.9.  BMP was pending.  Stool guaiacs were noted to be positive.  Influenza and COVID-19 screening were negative.  Nosebleed was contained after patient received Afrin.  TRH called to admit for work-up in regards to patient's anemia.  Review of Systems  HENT:  Positive for nosebleeds.   Eyes:  Negative for photophobia and pain.  Cardiovascular:  Negative for chest pain and leg  swelling.  Gastrointestinal:  Negative for abdominal pain, diarrhea and vomiting.  Musculoskeletal:  Positive for back pain and joint pain.  Neurological:  Negative for loss of consciousness.  Psychiatric/Behavioral:  Positive for substance abuse.   All other systems reviewed and are negative.  Past Medical History:  Diagnosis Date   Arthritis     Past Surgical History:  Procedure Laterality Date   FRACTURE SURGERY     facial      reports that he has been smoking cigarettes. He has been smoking an average of .5 packs per day. He has never used smokeless tobacco. He reports that he does not use drugs. No history on file for alcohol use.  No Known Allergies  No family history on file.  Prior to Admission medications   Medication Sig Start Date End Date Taking? Authorizing Provider  HYDROcodone-acetaminophen (NORCO/VICODIN) 5-325 MG tablet Take 1-2 tablets by mouth every 6 hours as needed for pain and/or cough. Patient not taking: Reported on 08/19/2021 09/18/17   Pisciotta, Joni Reining, PA-C  omeprazole (PRILOSEC) 20 MG capsule Take 1 capsule (20 mg total) by mouth daily. Patient not taking: Reported on 08/19/2021 12/31/15   Everlene Farrier, PA-C  ondansetron (ZOFRAN) 4 MG tablet Take 1 tablet (4 mg total) by mouth every 8 (eight) hours as needed for nausea or vomiting. Patient not taking: Reported on 08/19/2021 10/23/20   Muthersbaugh, Dahlia Client, PA-C    Physical Exam:  Constitutional: Older male who appears to be  in no acute distress and resting in the hospital bed Vitals:   08/19/21 0430 08/19/21 0500 08/19/21 0650 08/19/21 0655  BP: 103/64 (!) 98/48 117/69   Pulse: 62 (!) 59 (!) 59 (!) 57  Resp: 15   17  Temp:      TempSrc:      SpO2: 99% 99% 100% 99%   Eyes: PERRL, lids and conjunctivae normal ENMT: Mucous membranes are moist.  No bleeding from the right nare. Neck: normal, supple, no masses, no thyromegaly Respiratory: clear to auscultation bilaterally, no wheezing, no  crackles. Normal respiratory effort. No accessory muscle use.  Cardiovascular: Regular rate and rhythm, no murmurs / rubs / gallops. No extremity edema. 2+ pedal pulses. No carotid bruits.  Abdomen: no tenderness, no masses palpated.  Bowel sounds positive.  Musculoskeletal: no clubbing / cyanosis. No joint deformity upper and lower extremities. Good ROM, no contractures.  Skin: no rashes, lesions, ulcers. No induration Neurologic: CN 2-12 grossly intact. Sensation intact, DTR normal. Strength 5/5 in all 4.  Psychiatric: Alert and oriented x 3.  mood.     Labs on Admission: I have personally reviewed following labs and imaging studies  CBC: Recent Labs  Lab 08/19/21 0226 08/19/21 0328  WBC 5.9 5.7  HGB 6.7* 6.5*  HCT 22.5* 21.6*  MCV 78.9* 78.8*  PLT 287 283   Basic Metabolic Panel: No results for input(s): NA, K, CL, CO2, GLUCOSE, BUN, CREATININE, CALCIUM, MG, PHOS in the last 168 hours. GFR: CrCl cannot be calculated (Patient's most recent lab result is older than the maximum 21 days allowed.). Liver Function Tests: No results for input(s): AST, ALT, ALKPHOS, BILITOT, PROT, ALBUMIN in the last 168 hours. No results for input(s): LIPASE, AMYLASE in the last 168 hours. No results for input(s): AMMONIA in the last 168 hours. Coagulation Profile: Recent Labs  Lab 08/19/21 0328  INR 1.1   Cardiac Enzymes: No results for input(s): CKTOTAL, CKMB, CKMBINDEX, TROPONINI in the last 168 hours. BNP (last 3 results) No results for input(s): PROBNP in the last 8760 hours. HbA1C: No results for input(s): HGBA1C in the last 72 hours. CBG: No results for input(s): GLUCAP in the last 168 hours. Lipid Profile: No results for input(s): CHOL, HDL, LDLCALC, TRIG, CHOLHDL, LDLDIRECT in the last 72 hours. Thyroid Function Tests: No results for input(s): TSH, T4TOTAL, FREET4, T3FREE, THYROIDAB in the last 72 hours. Anemia Panel: No results for input(s): VITAMINB12, FOLATE, FERRITIN, TIBC,  IRON, RETICCTPCT in the last 72 hours. Urine analysis:    Component Value Date/Time   COLORURINE YELLOW 10/22/2020 2226   APPEARANCEUR CLEAR 10/22/2020 2226   LABSPEC 1.027 10/22/2020 2226   PHURINE 6.0 10/22/2020 2226   GLUCOSEU NEGATIVE 10/22/2020 2226   HGBUR NEGATIVE 10/22/2020 2226   BILIRUBINUR NEGATIVE 10/22/2020 2226   KETONESUR NEGATIVE 10/22/2020 2226   PROTEINUR NEGATIVE 10/22/2020 2226   NITRITE NEGATIVE 10/22/2020 2226   LEUKOCYTESUR NEGATIVE 10/22/2020 2226   Sepsis Labs: No results found for this or any previous visit (from the past 240 hour(s)).   Radiological Exams on Admission: No results found.    Assessment/Plan Acute blood loss anemia   epistaxis: Acute.  Patient presents with complaints of a nosebleed.  Nosebleed resolved spontaneously.  Found to have hemoglobin 6.5 with MCV 78.8 and MCH 23.7.  Previously hemoglobin was 11.1 in February of this year.  Stool guaiacs were also noted to be positive.  Suspect iron deficiency related with blood loss.  Possibly related with nosebleed and/or blood loss  from other GI source that is possible polyp or malignancy. -Admit to a medical telemetry bed -Follow-up anemia panel -Continue with transfusion of 1 unit of packed red blood cell -Recheck H&H posttransfusion -Clear liquid diet initially, but patient requesting regular diet -Protonix -Iron infusion -Dr. Rhetta Mura consult given the microcytic hypochromic anemia over the age of 12 is never had a colonoscopy before as he is at increased risk for colon cancer.  Recommended to change to clear liquid diet tomorrow morning and they will formally see.  Transient hypotension: Resolved.  Blood pressures initially noted to be as low as 98/48, but improved after patient received blood transfusion. -Continue to monitor  Homeless: Patient does have a primary care provider Lavinia Sharps, NP -TOC consulted  Polysubstance abuse: Patient did not quantify how much he smokes, but  did admit to smoking cocaine last time 5 days ago. -Nicotine patch offered -Ativan IV as needed for anxiety or withdrawals from cocaine  DVT prophylaxis: SCDs Code Status: Full Family Communication: None request Disposition Plan: To be determined Consults called: None Admission status: Inpatient require more than 2 midnight stay for work-up of anemia  Clydie Braun MD [facility]   If 7PM-7AM, please contact night-coverage   08/19/2021, 7:11 AM

## 2021-08-19 NOTE — ED Notes (Signed)
Called floor to initiate purple man process.

## 2021-08-19 NOTE — ED Notes (Signed)
Portable Xray at bedside.

## 2021-08-20 ENCOUNTER — Telehealth: Payer: Self-pay | Admitting: Nurse Practitioner

## 2021-08-20 DIAGNOSIS — D62 Acute posthemorrhagic anemia: Principal | ICD-10-CM

## 2021-08-20 DIAGNOSIS — R195 Other fecal abnormalities: Secondary | ICD-10-CM

## 2021-08-20 DIAGNOSIS — D508 Other iron deficiency anemias: Secondary | ICD-10-CM

## 2021-08-20 LAB — TYPE AND SCREEN
ABO/RH(D): A POS
Antibody Screen: NEGATIVE
Unit division: 0

## 2021-08-20 LAB — CBC
HCT: 23.2 % — ABNORMAL LOW (ref 39.0–52.0)
Hemoglobin: 7 g/dL — ABNORMAL LOW (ref 13.0–17.0)
MCH: 23.5 pg — ABNORMAL LOW (ref 26.0–34.0)
MCHC: 30.2 g/dL (ref 30.0–36.0)
MCV: 77.9 fL — ABNORMAL LOW (ref 80.0–100.0)
Platelets: 308 10*3/uL (ref 150–400)
RBC: 2.98 MIL/uL — ABNORMAL LOW (ref 4.22–5.81)
RDW: 16.6 % — ABNORMAL HIGH (ref 11.5–15.5)
WBC: 5.6 10*3/uL (ref 4.0–10.5)
nRBC: 0 % (ref 0.0–0.2)

## 2021-08-20 LAB — BASIC METABOLIC PANEL
Anion gap: 7 (ref 5–15)
BUN: 10 mg/dL (ref 8–23)
CO2: 27 mmol/L (ref 22–32)
Calcium: 8.7 mg/dL — ABNORMAL LOW (ref 8.9–10.3)
Chloride: 104 mmol/L (ref 98–111)
Creatinine, Ser: 0.97 mg/dL (ref 0.61–1.24)
GFR, Estimated: >60 mL/min (ref >60)
Glucose, Bld: 182 mg/dL — ABNORMAL HIGH (ref 70–99)
Potassium: 3.7 mmol/L (ref 3.5–5.1)
Sodium: 138 mmol/L (ref 135–145)

## 2021-08-20 LAB — BPAM RBC
Blood Product Expiration Date: 202212312359
ISSUE DATE / TIME: 202212251027
Unit Type and Rh: 6200

## 2021-08-20 LAB — HEMOGLOBIN AND HEMATOCRIT, BLOOD
HCT: 23.1 % — ABNORMAL LOW (ref 39.0–52.0)
Hemoglobin: 7.2 g/dL — ABNORMAL LOW (ref 13.0–17.0)

## 2021-08-20 MED ORDER — PANTOPRAZOLE SODIUM 40 MG PO TBEC
40.0000 mg | DELAYED_RELEASE_TABLET | Freq: Two times a day (BID) | ORAL | 1 refills | Status: AC
Start: 2021-08-20 — End: ?

## 2021-08-20 MED ORDER — SALINE SPRAY 0.65 % NA SOLN
1.0000 | NASAL | Status: DC | PRN
  Filled 2021-08-20: qty 44

## 2021-08-20 NOTE — TOC Transition Note (Signed)
Transition of Care [facility]) - CM/SW Discharge Note   Patient Details  Name: Travis Heath MRN: [id number] Date of Birth: 31-Jan-1961  [facility]) CM/SW Contact:  Tom-Johnson, Hershal Coria, RN Phone Number: 08/20/2021, 5:02 PM   Clinical Narrative:    Patient is scheduled for discharge today. [employer] cab voucher given to RN for transportation. No further TOC needs noted.   Final next level of care: Home/Self Care Barriers to Discharge: Barriers Resolved   Patient Goals and CMS Choice Patient states their goals for this hospitalization and ongoing recovery are:: To go home CMS Medicare.gov Compare Post Acute Care list provided to:: Patient Choice offered to / list presented to : NA  Discharge Placement                       Discharge Plan and Services   Discharge Planning Services: CM Consult            DME Arranged: N/A DME Agency: NA       HH Arranged: NA HH Agency: NA        Social Determinants of Health (SDOH) Interventions     Readmission Risk Interventions No flowsheet data found.

## 2021-08-20 NOTE — Progress Notes (Signed)
Travis Heath to be discharged Home per MD order. Discussed prescriptions and follow up appointments with the patient. Prescriptions given to patient; medication list explained in detail. Patient verbalized understanding.  Skin clean, dry and intact without evidence of skin break down, no evidence of skin tears noted. IV catheter discontinued intact. Site without signs and symptoms of complications. Dressing and pressure applied. Pt denies pain at the site currently. No complaints noted.  Patient free of lines, drains, and wounds.   An After Visit Summary (AVS) was printed and given to the patient. Patient escorted via wheelchair, and discharged home via private auto.  [name], RN

## 2021-08-20 NOTE — Progress Notes (Addendum)
See d/c note

## 2021-08-20 NOTE — Consult Note (Signed)
Referring Provider: Dr. Madelyn Flavors  Primary Care Physician:  Hilbert Corrigan Chales Abrahams, NP Primary Gastroenterologist:  Gentry Fitz   Reason for Consultation:  Anemia, epistaxis with hematemesis  HPI: Travis Heath is a 60 y.o. male with a past medical history of arthritis, tobacco an cocaine abuse. He is homeless.   He presented to the ED after having a significant nose bleed. He reported having intermittent nosebleeds for a few years which typically occurs after blowing his nose.  He developed another nose bleed 08/19/2021 with a heavy blood flow with clots which lasted about on hour. He swallowed a lot of blood and subsequently vomited up the blood.  Denies passing any black stools.  No rectal bleeding.  He typically passes a normal brown bowel movement most days.  He last smoked cocaine 2 or 3 years ago but informed the ED physician he smoked crack 5 days ago.  He snorted cocaine in the distant past.  Abstinent from alcohol for 15 years.  Never had an EGD.  Father with history of colon cancer. He underwent a colonoscopy once while  living in [place] out 10 years ago which was unremarkable.  Denies history of alcohol abuse.  He was taking a medicine for back pain but he is unable to tell me the name of this medication.  He denies taking any NSAIDs/BC/Goody powder. He is a challenging historian. Admsision Hg 6.7 -> 6.5 (baseline Hg 11.1 on 10/22/2020). BUN 17. FOBT +. Rectal exam done by the ED PA-C showed brown stool, no hematochezia or melena. Transfused one unit of PRBCs -> post transfusion Hg 8.0. Received Ferrlecit IV x 1. Pantoprazole 40mg  IV x 1.  Past Medical History:  Diagnosis Date   Arthritis     Past Surgical History:  Procedure Laterality Date   FRACTURE SURGERY     facial     Prior to Admission medications   Medication Sig Start Date End Date Taking? Authorizing Provider  HYDROcodone-acetaminophen (NORCO/VICODIN) 5-325 MG tablet Take 1-2 tablets by mouth every 6  hours as needed for pain and/or cough. Patient not taking: Reported on 08/19/2021 09/18/17   Pisciotta, 09/20/17, PA-C  omeprazole (PRILOSEC) 20 MG capsule Take 1 capsule (20 mg total) by mouth daily. Patient not taking: Reported on 08/19/2021 12/31/15   03/01/16, PA-C  ondansetron (ZOFRAN) 4 MG tablet Take 1 tablet (4 mg total) by mouth every 8 (eight) hours as needed for nausea or vomiting. Patient not taking: Reported on 08/19/2021 10/23/20   Muthersbaugh, 10/25/20, PA-C    Current Facility-Administered Medications  Medication Dose Route Frequency Provider Last Rate Last Admin   acetaminophen (TYLENOL) tablet 650 mg  650 mg Oral Q6H PRN Dahlia Client, MD       Or   acetaminophen (TYLENOL) suppository 650 mg  650 mg Rectal Q6H PRN Clydie Braun A, MD       albuterol (PROVENTIL) (2.5 MG/3ML) 0.083% nebulizer solution 2.5 mg  2.5 mg Nebulization Q6H PRN Madelyn Flavors, Rondell A, MD       ferric gluconate (FERRLECIT) 250 mg in sodium chloride 0.9 % 250 mL IVPB  250 mg Intravenous Q1400 Katrinka Blazing A, MD 135 mL/hr at 08/19/21 1745 250 mg at 08/19/21 1745   LORazepam (ATIVAN) injection 0.5 mg  0.5 mg Intravenous Q6H PRN 08/21/21 A, MD       nicotine (NICODERM CQ - dosed in mg/24 hours) patch 21 mg  21 mg Transdermal Daily Smith, Rondell A, MD   21 mg at  08/19/21 1621   pantoprazole (PROTONIX) EC tablet 40 mg  40 mg Oral BID Madelyn Flavors A, MD   40 mg at 08/19/21 1744   sodium chloride flush (NS) 0.9 % injection 3 mL  3 mL Intravenous Q12H Smith, Rondell A, MD   3 mL at 08/19/21 2233    Allergies as of 08/19/2021   (No Known Allergies)    No family history on file.  Social History   Socioeconomic History   Marital status: Single    Spouse name: Not on file   Number of children: Not on file   Years of education: Not on file   Highest education level: Not on file  Occupational History   Not on file  Tobacco Use   Smoking status: Every Day    Packs/day: 0.50    Types:  Cigarettes   Smokeless tobacco: Never  Substance and Sexual Activity   Alcohol use: Not on file   Drug use: No   Sexual activity: Not on file  Other Topics Concern   Not on file  Social History Narrative   Not on file   Social Determinants of Health   Financial Resource Strain: Not on file  Food Insecurity: Not on file  Transportation Needs: Not on file  Physical Activity: Not on file  Stress: Not on file  Social Connections: Not on file  Intimate Partner Violence: Not on file    Review of Systems: See HPI, all other systems reviewed and are negative  Physical Exam: Vital signs in last 24 hours: Temp:  [98.2 F (36.8 C)-98.6 F (37 C)] 98.3 F (36.8 C) (12/25 2008) Pulse Rate:  [50-72] 66 (12/25 2008) Resp:  [14-25] 18 (12/25 2008) BP: (102-117)/(58-86) 104/58 (12/25 2008) SpO2:  [96 %-100 %] 98 % (12/25 2008) Last BM Date: 08/18/21 General:  Alert thin 60 year old male in no acute distress Head:  Normocephalic and atraumatic. Eyes:  No scleral icterus. Conjunctiva pink. Ears:  Normal auditory acuity. Nose:  No deformity, discharge or lesions. Mouth: Absent dentition no ulcers or lesions.  Neck:  Supple. No lymphadenopathy or thyromegaly.  Lungs: Breath sounds clear throughout Heart: Regular rate and rhythm, no murmurs. Abdomen: Soft, nontender.  Nontender.  Positive bowel sounds all 4 quadrants.  No HSM. Rectal: Deferred. Musculoskeletal:  Symmetrical without gross deformities.  Pulses:  Normal pulses noted. Extremities:  Without clubbing or edema. Neurologic:  Alert and  oriented x4. No focal deficits.  Skin:  Intact without significant lesions or rashes. Psych:  Alert and cooperative. Normal mood and affect.  Intake/Output from previous day: 12/25 0701 - 12/26 0700 In: 448 [Blood:430; IV Piggyback:18] Out: 1300 [Urine:1300] Intake/Output this shift: No intake/output data recorded.  Lab Results: Recent Labs    08/19/21 0226 08/19/21 0328  08/19/21 1657  WBC 5.9 5.7  --   HGB 6.7* 6.5* 8.0*  HCT 22.5* 21.6* 24.8*  PLT 287 283  --    BMET Recent Labs    08/19/21 0656  NA 138  K 4.7  CL 108  CO2 26  GLUCOSE 104*  BUN 17  CREATININE 0.89  CALCIUM 8.4*   LFT Recent Labs    08/19/21 0944  PROT 5.7*  ALBUMIN 3.0*  AST 17  ALT 13  ALKPHOS 40  BILITOT 0.3  BILIDIR <0.1  IBILI NOT CALCULATED   PT/INR Recent Labs    08/19/21 0328  LABPROT 13.8  INR 1.1   Hepatitis Panel No results for input(s): HEPBSAG, HCVAB, HEPAIGM, HEPBIGM in the  last 72 hours.    Studies/Results: DG CHEST PORT 1 VIEW  Result Date: 08/19/2021 CLINICAL DATA:  Nose bleeding, vomiting EXAM: PORTABLE CHEST 1 VIEW COMPARISON:  09/05/2020 FINDINGS: The heart size and mediastinal contours are within normal limits. Both lungs are clear. The visualized skeletal structures are unremarkable. IMPRESSION: No active disease. Electronically Signed   By: Judie Petit.  Shick M.D.   On: 08/19/2021 08:28    IMPRESSION/PLAN:  71) 60 year old male with epistaxis and IDA. No GERD sx or abdominal pain. Hg 6.7 (baseline Hg 11.1). BUN 17.  Iron 12. FOBT +. Transfused 1 unit of PRBCs -> Hg 8.0 -> today Hg 7.0. Received IV iron.  -Recommend ENT consult for epistaxis, defer to the medical service -EGD and  colonoscopy 08/21/2021 recommended due to his significant iron deficiency anemia and positive family history of colon cancer in a first-degree relative. Patient Clines EGD or colonoscopy evaluation during this hospital admission.  He wishes to follow-up with his PCP and will consider an EGD and colonoscopy as an outpatient. -Clear liquid diet, advance if no further epistaxis or evidence of active GI bleeding occurs -Check Q 12 hours next 24 hours  -Transfuse for Hg < 8 -PPI bid  -Await further recommendations per Dr. Janine Limbo Roxanna Mew  08/20/2021, 2:22 PM

## 2021-08-20 NOTE — TOC Initial Note (Signed)
Transition of Care [facility]) - Initial/Assessment Note    Patient Details  Name: Travis Heath MRN: [id number] Date of Birth: 08/09/1961  [facility]) CM/SW Contact:    Tom-Johnson, Hershal Coria, RN Phone Number: 08/20/2021, 2:52 PM  Clinical Narrative:                  CM consulted for PCP needs. CM spoke with patient at bedside. States he recently was living with his God mother but not anymore. Does not have a permanent address. States he has two adult children but could not live with any of them. States he has a Case worker named Office manager with [employer] and uses the facility for his medical needs and they supply his medications as well. States Katrina is working on finding him somewhere to live. Sates he does not drive as his license has been revoked, uses public transportation. Will be needing transportation at discharge. CM called to speak with Katrina per patient's request but the office is closed today due to the holidays. CM left a secure message to call. No other needs noted at this time. CM will follow with needs.      Barriers to Discharge: Continued Medical Work up   Patient Goals and CMS Choice Patient states their goals for this hospitalization and ongoing recovery are:: To go home CMS Medicare.gov Compare Post Acute Care list provided to:: Patient Choice offered to / list presented to : Patient  Expected Discharge Plan and Services     Discharge Planning Services: CM Consult   Living arrangements for the past 2 months: No permanent address                 DME Arranged: N/A DME Agency: NA       HH Arranged: NA HH Agency: NA        Prior Living Arrangements/Services Living arrangements for the past 2 months: No permanent address Lives with:: Self Patient language and need for interpreter reviewed:: Yes Do you feel safe going back to the place where you live?: Yes      Need for Family Participation in Patient Care: Yes  (Comment) Care giver support system in place?: Yes (comment)   Criminal Activity/Legal Involvement Pertinent to Current Situation/Hospitalization: No - Comment as needed  Activities of Daily Living      Permission Sought/Granted Permission sought to share information with : Case Manager, Family Supports, Magazine features editor Permission granted to share information with : Yes, Verbal Permission Granted              Emotional Assessment Appearance:: Appears stated age Attitude/Demeanor/Rapport: Engaged, Gracious Affect (typically observed): Accepting, Appropriate, Calm, Hopeful Orientation: : Oriented to Self, Oriented to Place, Oriented to  Time, Oriented to Situation Alcohol / Substance Use: Not Applicable Psych Involvement: No (comment)  Admission diagnosis:  Acute blood loss anemia [D62] Occult blood in stools [R19.5] Right-sided epistaxis [R04.0] Anemia, unspecified type [D64.9] Patient Active Problem List   Diagnosis Date Noted   Acute blood loss anemia 08/19/2021   Polysubstance abuse (HCC) 08/19/2021   Homeless 08/19/2021   Transient hypotension 08/19/2021   Epistaxis 08/19/2021   PCP:  Lavinia Sharps, NP Pharmacy:   [facility] [address] [place] [address] Phone: [phone] Fax: [phone]     Social Determinants of Health (SDOH) Interventions    Readmission Risk Interventions No flowsheet data found.

## 2021-08-20 NOTE — Discharge Summary (Signed)
Physician Discharge Summary  Travis Heath XNT:[id number] DOB: Sep 02, 1960 DOA: 08/19/2021  PCP: Lavinia Sharps, NP  Admit date: 08/19/2021 Discharge date: 08/20/2021  Admitted From: homeless Disposition: same  Recommendations for Outpatient Follow-up:  Follow up with PCP in 1 weeks Please obtain CBC in one week Please follow up with GI to discuss EGD/colonoscopy   Home Health:no  Equipment/Devices: none  Discharge Condition:stable CODE STATUS: full Diet recommendation: Heart Healthy    Brief/Interim Summary: Travis Heath is a 60 y.o. male with medical history significant for arthritis, tobacco abuse, and cocaine abuse (remote hx snorting cocaine, reports he now smokes it) presents after having acute onset of a right nostril nosebleed last night.  He denies doing anything in particular to onset symptoms.  Reports that it was bleeding very heavily for approximately an hour.  Notes that he was bleeding so much that he had swallowed a lot of blood as a result of the bleed which made him vomit all the swallowed blood.  His last use of cocaine was 5 days ago, but he states that he does not snort cocaine anymore and only smokes it.  He denies using any NSAIDs, abdominal pain, or dark stools/blood in stools to his knowledge. He has never had a colonoscopy. Hgb 6.5 and was transfused. Nosebleed responded to Afrin. Admitted for anemia and GI consult re: scope. Pt declined GI interventions inpatient and would like to pursue outpatient d/c if possible    DISCHARGE DIAGNOSES   Principal Problem:   Acute blood loss anemia Active Problems:   Polysubstance abuse (HCC)   Homeless   Transient hypotension   Epistaxis   Microcytic Anemia 2/2 underlying iron deficiency and complicated by acute severe epistaxis, stable s/p transfusion Pt reports swallowing blood which may explain (+)hemoccult however given iron deficiency in patient due for screen for colon cancer, GI consulted and pt will  follow-up w/ GI outpatient, declined scope inpatient  Epistaxis Consider ENT consult if recurrent bleed   Polysubstance abuse Denies snorting cocaine for past many years but he did used to do this Reports EtOH use  Hypotension - transient, resolved     DVT prophylaxis: SCD, no anticoagulation d/t anemia and concern for GI bleed Code Status: FULL Family Communication: NONE - patient alert and has full capacity  Disposition Plan: home PENDING 1) GI recs re: scope inpatient vs outpatient, 2) stabilization of Hgb   Consultants:  GI  Procedures: Blood transfusion   Antimicrobials: none    Subjective: Patient reports feeling well, tired but much better after getting blood. Minor additional nosebleed but "normal" for him. No abdominal pain, no dark/black stool.   Objective: BP 104/74    Pulse (!) 58    Temp 98.8 F (37.1 C) (Oral)    Resp 19    SpO2 97%   Intake/Output Summary (Last 24 hours) at 08/20/2021 1228 Last data filed at 08/20/2021 0900 Gross per 24 hour  Intake 668 ml  Output 1300 ml  Net -632 ml   There were no vitals filed for this visit.  Examination:  General exam: Appears calm and comfortable  Respiratory system: Clear to auscultation. Respiratory effort normal. Cardiovascular system: S1 & S2 heard, RRR. No JVD, murmurs, rubs, gallops or clicks. No pedal edema. Gastrointestinal system: Abdomen is nondistended, soft and nontender. No organomegaly or masses felt. Normal bowel sounds heard. Central nervous system: Alert and oriented. No focal neurological deficits. Extremities: Symmetric 5 x 5 power. Skin: No rashes, lesions or ulcers Psychiatry: Judgement and insight  appear normal. Mood & affect appropriate.     Data Reviewed: I have personally reviewed following labs and imaging studies  CBC: Recent Labs  Lab 08/19/21 0226 08/19/21 0328 08/19/21 1657 08/20/21 0531  WBC 5.9 5.7  --  5.6  HGB 6.7* 6.5* 8.0* 7.0*  HCT 22.5* 21.6* 24.8* 23.2*   MCV 78.9* 78.8*  --  77.9*  PLT 287 283  --  308   Basic Metabolic Panel: Recent Labs  Lab 08/19/21 0656 08/20/21 0857  NA 138 138  K 4.7 3.7  CL 108 104  CO2 26 27  GLUCOSE 104* 182*  BUN 17 10  CREATININE 0.89 0.97  CALCIUM 8.4* 8.7*   GFR: CrCl cannot be calculated (Unknown ideal weight.). Liver Function Tests: Recent Labs  Lab 08/19/21 0944  AST 17  ALT 13  ALKPHOS 40  BILITOT 0.3  PROT 5.7*  ALBUMIN 3.0*   No results for input(s): LIPASE, AMYLASE in the last 168 hours. No results for input(s): AMMONIA in the last 168 hours. Coagulation Profile: Recent Labs  Lab 08/19/21 0328  INR 1.1   Cardiac Enzymes: No results for input(s): CKTOTAL, CKMB, CKMBINDEX, TROPONINI in the last 168 hours. BNP (last 3 results) No results for input(s): PROBNP in the last 8760 hours. HbA1C: No results for input(s): HGBA1C in the last 72 hours. CBG: No results for input(s): GLUCAP in the last 168 hours. Lipid Profile: No results for input(s): CHOL, HDL, LDLCALC, TRIG, CHOLHDL, LDLDIRECT in the last 72 hours. Thyroid Function Tests: Recent Labs    08/19/21 0944  TSH 0.878   Anemia Panel: Recent Labs    08/19/21 0944  VITAMINB12 188  FOLATE 9.0  FERRITIN 2*  TIBC 424  IRON 12*  RETICCTPCT 1.4   Urine analysis:    Component Value Date/Time   COLORURINE YELLOW 10/22/2020 2226   APPEARANCEUR CLEAR 10/22/2020 2226   LABSPEC 1.027 10/22/2020 2226   PHURINE 6.0 10/22/2020 2226   GLUCOSEU NEGATIVE 10/22/2020 2226   HGBUR NEGATIVE 10/22/2020 2226   BILIRUBINUR NEGATIVE 10/22/2020 2226   KETONESUR NEGATIVE 10/22/2020 2226   PROTEINUR NEGATIVE 10/22/2020 2226   NITRITE NEGATIVE 10/22/2020 2226   LEUKOCYTESUR NEGATIVE 10/22/2020 2226   Sepsis Labs: (procalcitonin:4,lacticidven:4)  ) Recent Results (from the past 240 hour(s))  Resp Panel by RT-PCR (Flu A&B, Covid) Nasopharyngeal Swab     Status: None   Collection Time: 08/19/21  5:41 AM   Specimen:  Nasopharyngeal Swab; Nasopharyngeal(NP) swabs in vial transport medium  Result Value Ref Range Status   SARS Coronavirus 2 by RT PCR NEGATIVE NEGATIVE Final    Comment: (NOTE) SARS-CoV-2 target nucleic acids are NOT DETECTED.  The SARS-CoV-2 RNA is generally detectable in upper respiratory specimens during the acute phase of infection. The lowest concentration of SARS-CoV-2 viral copies this assay can detect is 138 copies/mL. A negative result does not preclude SARS-Cov-2 infection and should not be used as the sole basis for treatment or other patient management decisions. A negative result may occur with  improper specimen collection/handling, submission of specimen other than nasopharyngeal swab, presence of viral mutation(s) within the areas targeted by this assay, and inadequate number of viral copies(<138 copies/mL). A negative result must be combined with clinical observations, patient history, and epidemiological information. The expected result is Negative.  Fact Sheet for Patients:  BloggerCourse.com  Fact Sheet for Healthcare Providers:  SeriousBroker.it  This test is no t yet approved or cleared by the [place] and  has been authorized for  detection and/or diagnosis of SARS-CoV-2 by FDA under an Emergency Use Authorization (EUA). This EUA will remain  in effect (meaning this test can be used) for the duration of the COVID-19 declaration under Section 564(b)(1) of the Act, 21 U.S.C.section 360bbb-3(b)(1), unless the authorization is terminated  or revoked sooner.       Influenza A by PCR NEGATIVE NEGATIVE Final   Influenza B by PCR NEGATIVE NEGATIVE Final    Comment: (NOTE) The Xpert Xpress SARS-CoV-2/FLU/RSV plus assay is intended as an aid in the diagnosis of influenza from Nasopharyngeal swab specimens and should not be used as a sole basis for treatment. Nasal washings and aspirates are unacceptable for  Xpert Xpress SARS-CoV-2/FLU/RSV testing.  Fact Sheet for Patients: BloggerCourse.com  Fact Sheet for Healthcare Providers: SeriousBroker.it  This test is not yet approved or cleared by the [place] FDA and has been authorized for detection and/or diagnosis of SARS-CoV-2 by FDA under an Emergency Use Authorization (EUA). This EUA will remain in effect (meaning this test can be used) for the duration of the COVID-19 declaration under Section 564(b)(1) of the Act, 21 U.S.C. section 360bbb-3(b)(1), unless the authorization is terminated or revoked.  Performed at [facility], [address], [place], [place] [address]          Radiology Studies: DG CHEST PORT 1 VIEW  Result Date: 08/19/2021 CLINICAL DATA:  Nose bleeding, vomiting EXAM: PORTABLE CHEST 1 VIEW COMPARISON:  09/05/2020 FINDINGS: The heart size and mediastinal contours are within normal limits. Both lungs are clear. The visualized skeletal structures are unremarkable. IMPRESSION: No active disease. Electronically Signed   By: Judie Petit.  Shick M.D.   On: 08/19/2021 08:28        Scheduled Meds:  nicotine  21 mg Transdermal Daily   pantoprazole  40 mg Oral BID   sodium chloride flush  3 mL Intravenous Q12H   Continuous Infusions:  ferric gluconate (FERRLECIT) IVPB 250 mg (08/19/21 1745)     LOS: 1 day    Time spent: 35 min    Sunnie Nielsen, DO [facility] Pager listed on AMION  If 7PM-7AM, please contact night-coverage [email or web address] Password [facility] 08/20/2021, 12:28 PM       Discharge Instructions  Discharge Instructions     Call MD for:  persistant dizziness or light-headedness   Complete by: As directed    Diet - low sodium heart healthy   Complete by: As directed    Discharge instructions   Complete by: As directed    Please schedule follow-up with Gi to discuss endoscopy/colonoscopy to evaluate anemia and for routine colon  cancer screening.   Increase activity slowly   Complete by: As directed       Allergies as of 08/20/2021   No Known Allergies      Medication List     STOP taking these medications    HYDROcodone-acetaminophen 5-325 MG tablet Commonly known as: NORCO/VICODIN   omeprazole 20 MG capsule Commonly known as: PRILOSEC       TAKE these medications    ondansetron 4 MG tablet Commonly known as: Zofran Take 1 tablet (4 mg total) by mouth every 8 (eight) hours as needed for nausea or vomiting.   pantoprazole 40 MG tablet Commonly known as: PROTONIX Take 1 tablet (40 mg total) by mouth 2 (two) times daily.        No Known Allergies  Consultations: Dr Meridee Score GI   Procedures/Studies: DG CHEST PORT 1 VIEW  Result Date: 08/19/2021 CLINICAL  DATA:  Nose bleeding, vomiting EXAM: PORTABLE CHEST 1 VIEW COMPARISON:  09/05/2020 FINDINGS: The heart size and mediastinal contours are within normal limits. Both lungs are clear. The visualized skeletal structures are unremarkable. IMPRESSION: No active disease. Electronically Signed   By: Judie Petit.  Shick M.D.   On: 08/19/2021 08:28   (Echo, Carotid, EGD, Colonoscopy, ERCP)     The results of significant diagnostics from this hospitalization (including imaging, microbiology, ancillary and laboratory) are listed below for reference.     Microbiology:  Labs: BNP (last 3 results) No results for input(s): BNP in the last 8760 hours. Basic Metabolic Panel: Recent Labs  Lab 08/19/21 0656 08/20/21 0857  NA 138 138  K 4.7 3.7  CL 108 104  CO2 26 27  GLUCOSE 104* 182*  BUN 17 10  CREATININE 0.89 0.97  CALCIUM 8.4* 8.7*   Liver Function Tests: Recent Labs  Lab 08/19/21 0944  AST 17  ALT 13  ALKPHOS 40  BILITOT 0.3  PROT 5.7*  ALBUMIN 3.0*   No results for input(s): LIPASE, AMYLASE in the last 168 hours. No results for input(s): AMMONIA in the last 168 hours. CBC: Recent Labs  Lab 08/19/21 0226 08/19/21 0328  08/19/21 1657 08/20/21 0531  WBC 5.9 5.7  --  5.6  HGB 6.7* 6.5* 8.0* 7.0*  HCT 22.5* 21.6* 24.8* 23.2*  MCV 78.9* 78.8*  --  77.9*  PLT 287 283  --  308   Cardiac Enzymes: No results for input(s): CKTOTAL, CKMB, CKMBINDEX, TROPONINI in the last 168 hours. BNP: Invalid input(s): POCBNP CBG: No results for input(s): GLUCAP in the last 168 hours. D-Dimer No results for input(s): DDIMER in the last 72 hours. Hgb A1c No results for input(s): HGBA1C in the last 72 hours. Lipid Profile No results for input(s): CHOL, HDL, LDLCALC, TRIG, CHOLHDL, LDLDIRECT in the last 72 hours. Thyroid function studies Recent Labs    08/19/21 0944  TSH 0.878   Anemia work up Recent Labs    08/19/21 0944  VITAMINB12 188  FOLATE 9.0  FERRITIN 2*  TIBC 424  IRON 12*  RETICCTPCT 1.4   Urinalysis    Component Value Date/Time   COLORURINE YELLOW 10/22/2020 2226   APPEARANCEUR CLEAR 10/22/2020 2226   LABSPEC 1.027 10/22/2020 2226   PHURINE 6.0 10/22/2020 2226   GLUCOSEU NEGATIVE 10/22/2020 2226   HGBUR NEGATIVE 10/22/2020 2226   BILIRUBINUR NEGATIVE 10/22/2020 2226   KETONESUR NEGATIVE 10/22/2020 2226   PROTEINUR NEGATIVE 10/22/2020 2226   NITRITE NEGATIVE 10/22/2020 2226   LEUKOCYTESUR NEGATIVE 10/22/2020 2226   Sepsis Labs Invalid input(s): PROCALCITONIN,  WBC,  LACTICIDVEN Microbiology   Time coordinating discharge: Over 40 minutes  SIGNED:   Sunnie Nielsen, MD  [facility] 08/20/2021, 4:33 PM Pager   If 7PM-7AM, please contact night-coverage [email or web address] Password [email or web address]

## 2021-08-20 NOTE — Telephone Encounter (Signed)
Beth, pls contact patient in a few days and see if he wishes to follow up in our office to further discuss scheduling an EGD and colonoscopy, as he did not wish to have done during his hospital admission. He was discharged home this evening. Thx

## 2021-08-21 SURGERY — EGD (ESOPHAGOGASTRODUODENOSCOPY)
Anesthesia: Monitor Anesthesia Care

## 2021-08-21 NOTE — Telephone Encounter (Signed)
Would you contact this patient and ask him if he will schedule a follow up appointment with [place], please?

## 2021-08-22 NOTE — Telephone Encounter (Signed)
Left vm to return call.    

## 2021-08-30 NOTE — Telephone Encounter (Signed)
2nd attempt to reach patient     Left message to return call

## 2021-09-12 NOTE — Telephone Encounter (Signed)
Patient is not responding to our attempts to reach him. This note will be closed.

## 2021-09-26 ENCOUNTER — Emergency Department (HOSPITAL_COMMUNITY)
Admission: EM | Admit: 2021-09-26 | Discharge: 2021-09-26 | Disposition: A | Discharge status: Home / Self Care | Payer: No Typology Code available for payment source | Attending: Emergency Medicine | Admitting: Emergency Medicine

## 2021-09-26 ENCOUNTER — Emergency Department (HOSPITAL_COMMUNITY): Payer: No Typology Code available for payment source

## 2021-09-26 ENCOUNTER — Other Ambulatory Visit: Payer: Self-pay

## 2021-09-26 ENCOUNTER — Encounter (HOSPITAL_COMMUNITY): Payer: Self-pay | Admitting: Emergency Medicine

## 2021-09-26 DIAGNOSIS — Z5900 Homelessness unspecified: Secondary | ICD-10-CM | POA: Insufficient documentation

## 2021-09-26 DIAGNOSIS — S46912A Strain of unspecified muscle, fascia and tendon at shoulder and upper arm level, left arm, initial encounter: Secondary | ICD-10-CM | POA: Insufficient documentation

## 2021-09-26 DIAGNOSIS — Y92410 Unspecified street and highway as the place of occurrence of the external cause: Secondary | ICD-10-CM | POA: Insufficient documentation

## 2021-09-26 DIAGNOSIS — S46812A Strain of other muscles, fascia and tendons at shoulder and upper arm level, left arm, initial encounter: Secondary | ICD-10-CM

## 2021-09-26 DIAGNOSIS — S4992XA Unspecified injury of left shoulder and upper arm, initial encounter: Secondary | ICD-10-CM | POA: Diagnosis present

## 2021-09-26 MED ORDER — METHOCARBAMOL 500 MG PO TABS: 500.0000 mg | ORAL_TABLET | Freq: Two times a day (BID) | ORAL | 0 refills | Status: DC

## 2021-09-26 MED ORDER — ACETAMINOPHEN 325 MG PO TABS
650.0000 mg | ORAL_TABLET | ORAL | Status: AC
  Administered 2021-09-26: 650 mg via ORAL
  Filled 2021-09-26: qty 2
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
## 2021-09-26 NOTE — Discharge Instructions (Signed)
Your xray is negative for fracture.   Your systems are consistent with a muscle strain.  As we discussed warm Epson salt soaks, IcyHot and Voltaren gel are very helpful for this.  I have prescribed you a muscle relaxer although there is some evidence that these are not very helpful for muscle strains.  Please tell her the ibuprofen and Tylenol dosing below  Please use Tylenol or ibuprofen for pain.  You may use 600 mg ibuprofen every 6 hours or 1000 mg of Tylenol every 6 hours.  You may choose to alternate between the 2.  This would be most effective.  Not to exceed 4 g of Tylenol within 24 hours.  Not to exceed 3200 mg ibuprofen 24 hours.   Your examination today is most concerning for a muscular injury 1. Medications: alternate ibuprofen and tylenol for pain control, take all usual home medications as they are prescribed 2. Treatment: rest, ice, elevate and use an ACE wrap or other compressive therapy to decrease swelling. Also drink plenty of fluids and do plenty of gentle stretching and move the affected muscle through its normal range of motion to prevent stiffness. 3. Follow Up: If your symptoms do not improve please follow up with orthopedics/sports medicine or your PCP for discussion of your diagnoses and further evaluation after today's visit; if you do not have a primary care doctor use the resource guide provided to find one; Please return to the ER for worsening symptoms or other concerns.

## 2021-09-26 NOTE — ED Notes (Signed)
Pt verbalizes understanding of discharge instructions. Opportunity for questions and answers were provided. Pt discharged from the ED.   ?
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
## 2021-11-25 ENCOUNTER — Other Ambulatory Visit: Payer: Self-pay

## 2021-11-25 ENCOUNTER — Encounter (HOSPITAL_COMMUNITY): Payer: Self-pay | Admitting: Emergency Medicine

## 2021-11-25 ENCOUNTER — Emergency Department (HOSPITAL_COMMUNITY): Payer: Self-pay

## 2021-11-25 ENCOUNTER — Emergency Department (HOSPITAL_COMMUNITY)
Admission: EM | Admit: 2021-11-25 | Discharge: 2021-11-25 | Disposition: A | Discharge status: Home / Self Care | Payer: Self-pay | Attending: Emergency Medicine | Admitting: Emergency Medicine

## 2021-11-25 DIAGNOSIS — K567 Ileus, unspecified: Secondary | ICD-10-CM

## 2021-11-25 DIAGNOSIS — K5900 Constipation, unspecified: Secondary | ICD-10-CM

## 2021-11-25 LAB — CBC WITH DIFFERENTIAL/PLATELET
Abs Immature Granulocytes: 0.01 10*3/uL (ref 0.00–0.07)
Basophils Absolute: 0 10*3/uL (ref 0.0–0.1)
Basophils Relative: 1 %
Eosinophils Absolute: 0.1 10*3/uL (ref 0.0–0.5)
Eosinophils Relative: 3 %
HCT: 31.5 % — ABNORMAL LOW (ref 39.0–52.0)
Hemoglobin: 9.3 g/dL — ABNORMAL LOW (ref 13.0–17.0)
Immature Granulocytes: 0 %
Lymphocytes Relative: 21 %
Lymphs Abs: 1 10*3/uL (ref 0.7–4.0)
MCH: 21.8 pg — ABNORMAL LOW (ref 26.0–34.0)
MCHC: 29.5 g/dL — ABNORMAL LOW (ref 30.0–36.0)
MCV: 73.8 fL — ABNORMAL LOW (ref 80.0–100.0)
Monocytes Absolute: 0.4 10*3/uL (ref 0.1–1.0)
Monocytes Relative: 8 %
Neutro Abs: 3.3 10*3/uL (ref 1.7–7.7)
Neutrophils Relative %: 67 %
Platelets: 308 10*3/uL (ref 150–400)
RBC: 4.27 MIL/uL (ref 4.22–5.81)
RDW: 18.9 % — ABNORMAL HIGH (ref 11.5–15.5)
WBC: 4.9 10*3/uL (ref 4.0–10.5)
nRBC: 0 % (ref 0.0–0.2)

## 2021-11-25 LAB — COMPREHENSIVE METABOLIC PANEL
ALT: 13 U/L (ref 0–44)
AST: 16 U/L (ref 15–41)
Albumin: 3.5 g/dL (ref 3.5–5.0)
Alkaline Phosphatase: 47 U/L (ref 38–126)
Anion gap: 3 — ABNORMAL LOW (ref 5–15)
BUN: 6 mg/dL — ABNORMAL LOW (ref 8–23)
CO2: 28 mmol/L (ref 22–32)
Calcium: 9.2 mg/dL (ref 8.9–10.3)
Chloride: 108 mmol/L (ref 98–111)
Creatinine, Ser: 0.91 mg/dL (ref 0.61–1.24)
GFR, Estimated: >60 mL/min (ref >60)
Glucose, Bld: 97 mg/dL (ref 70–99)
Potassium: 4.3 mmol/L (ref 3.5–5.1)
Sodium: 139 mmol/L (ref 135–145)
Total Bilirubin: 0.3 mg/dL (ref 0.3–1.2)
Total Protein: 6.3 g/dL — ABNORMAL LOW (ref 6.5–8.1)

## 2021-11-25 MED ORDER — BISACODYL 5 MG PO TBEC: 5.0000 mg | DELAYED_RELEASE_TABLET | Freq: Two times a day (BID) | ORAL | 0 refills | Status: AC

## 2021-11-25 MED ORDER — POLYETHYLENE GLYCOL 3350 17 GM/SCOOP PO POWD
1.0000 | Freq: Once | ORAL | Status: AC
  Administered 2021-11-25: 255 g via ORAL
  Filled 2021-11-25: qty 255

## 2021-11-25 NOTE — ED Provider Notes (Signed)
?[name] ?Provider Note ? ? ?CSN: [id number] ?Arrival date & time: 11/25/21  0813 ? ?  ? ?History ? ?Chief Complaint  ?Patient presents with  ? Constipation  ? ? ?Travis Heath is a 61 y.o. male. ? ?HPI ? ?  ?61 yo male with PMH of anemia, polysubstance abuse, and homeless who presents to the ED today for 6 days of constipation. Pt states his last BM was Monday and normal but has been unable to go since. He states he has made several attempts to have a BM without success. He trying not to strain due history of prior hemorrhoids. Pt denies having a hemorrhoid currently. Pt states he not been eating like he normally does. He states he has ate a lot of processed meats including subs, hotdogs, and hamburgers. He has not been eating any fruits and vegetables due to limited availability. He reports some pain at his anus. He denies any abdominal pain, back pain, urgency, frequency, fever, or vomiting. He reports some nausea and dysuria. He has been drinking lots of water. He reports history of constipation in which he has used suppositories with improvement. He has never had to be disimpacted for his constipation. He has not taken any medications for his current symptoms. He states he drank a glass of milk to help with his constipation without relief. He denies any blood in prior stools or blood when wiping. He denies any recent drug or alcohol use. He does currently smoke cigarettes.  ? ?Home Medications ?Prior to Admission medications   ?Medication Sig Start Date End Date Taking? Authorizing Provider  ?acetaminophen (TYLENOL) 325 MG tablet Take 325 mg by mouth every 6 (six) hours as needed for mild pain or moderate pain.   Yes [provider]  ?bisacodyl (DULCOLAX) 5 MG EC tablet Take 1 tablet (5 mg total) by mouth 2 (two) times daily. 11/25/21  Yes Varney Biles, MD  ?meloxicam (MOBIC) 15 MG tablet Take 15 mg by mouth daily. 10/04/21  Yes [provider]   ?methocarbamol (ROBAXIN) 500 MG tablet Take 1 tablet (500 mg total) by mouth 2 (two) times daily. 09/26/21  Yes Fondaw, Ova Freshwater S, PA  ?ondansetron (ZOFRAN) 4 MG tablet Take 1 tablet (4 mg total) by mouth every 8 (eight) hours as needed for nausea or vomiting. ?Patient not taking: Reported on 08/19/2021 10/23/20   Muthersbaugh, Jarrett Soho, PA-C  ?pantoprazole (PROTONIX) 40 MG tablet Take 1 tablet (40 mg total) by mouth 2 (two) times daily. ?Patient not taking: Reported on 11/25/2021 08/20/21   Emeterio Reeve, DO  ?   ? ?Allergies    ?Patient has no known allergies.   ? ?Review of Systems   ?Review of Systems  ?All other systems reviewed and are negative. ? ?Physical Exam ?Updated Vital Signs ?BP 127/88 (BP Location: Right Arm)   Pulse 68   Temp 98 ?F (36.7 ?C) (Oral)   Resp 16   SpO2 100%  ?Physical Exam ?Vitals and nursing note reviewed.  ?Constitutional:   ?   Appearance: He is well-developed.  ?HENT:  ?   Head: Atraumatic.  ?Eyes:  ?   Extraocular Movements: Extraocular movements intact.  ?   Pupils: Pupils are equal, round, and reactive to light.  ?Cardiovascular:  ?   Rate and Rhythm: Normal rate.  ?Pulmonary:  ?   Effort: Pulmonary effort is normal.  ?Abdominal:  ?   Tenderness: There is no abdominal tenderness.  ?   Comments: Digital rectal exam reveals  a large stool ball in the rectal vault.  Able to disimpact only part of it.  ?Musculoskeletal:  ?   Cervical back: Neck supple.  ?Skin: ?   General: Skin is warm.  ?Neurological:  ?   Mental Status: He is alert and oriented to person, place, and time.  ? ? ?ED Results / Procedures / Treatments   ?Labs ?(all labs ordered are listed, but only abnormal results are displayed) ?Labs Reviewed  ?CBC WITH DIFFERENTIAL/PLATELET - Abnormal; Notable for the following components:  ?    Result Value  ? Hemoglobin 9.3 (*)   ? HCT 31.5 (*)   ? MCV 73.8 (*)   ? MCH 21.8 (*)   ? MCHC 29.5 (*)   ? RDW 18.9 (*)   ? All other components within normal limits  ?COMPREHENSIVE  METABOLIC PANEL - Abnormal; Notable for the following components:  ? BUN 6 (*)   ? Total Protein 6.3 (*)   ? Anion gap 3 (*)   ? All other components within normal limits  ?URINALYSIS, ROUTINE W REFLEX MICROSCOPIC  ? ? ?EKG ?None ? ?Radiology ?DG Abdomen Acute W/Chest ? ?Result Date: 11/25/2021 ?CLINICAL DATA:  Constipation, abdominal pain EXAM: DG ABDOMEN ACUTE WITH 1 VIEW CHEST COMPARISON:  None. FINDINGS: Cardiac size is within normal limits. Lung fields are clear of any infiltrates or pulmonary edema. There is no pleural effusion or pneumothorax. There is presence of gas in few slightly dilated small bowel loops in the left mid abdomen. There are no air-fluid levels in the small bowel loops. Moderate amount of stool is seen in the colon and rectum. No abnormal masses or calcifications are seen. Kidneys are mostly obscured by bowel contents limiting evaluation for small stones. Degenerative changes are noted in the lumbar spine with bony spurs. IMPRESSION: Presence of gas in few small bowel loops in the left mid abdomen may suggest ileus. There are no air-fluid levels in the small bowel loops. There is no pneumoperitoneum. No active disease is seen in the chest. Electronically Signed   By: Elmer Picker M.D.   On: 11/25/2021 13:20   ? ?Procedures ?Procedures  ? ? ?Medications Ordered in ED ?Medications  ?polyethylene glycol powder (GLYCOLAX/MIRALAX) container 255 g (has no administration in time range)  ? ? ?ED Course/ Medical Decision Making/ A&P ?  ?                        ?Medical Decision Making ?Amount and/or Complexity of Data Reviewed ?Labs: ordered. ?Radiology: ordered. ? ? ?61 year old patient comes in with chief complaint of constipation.  He has not had a bowel movement in 4 to 5 days.  He indicates that he is passing flatus, and when stool has come out with significant straining it is "liquidy".  He has tried digital rectal exam himself and had no success at disimpacting him, but he can feel hard  stool. ? ?On my exam, patient indeed does have hard stool in the proximal rectal vault.  Unable to disimpact him satisfactorily.  We will give him enema. ? ?X-ray was ordered and visualized independently.  There is clear evidence of severe constipation without significant air-fluid levels.  Patient is passing flatus, he denies any abdominal surgeries therefore I do not think he has small bowel obstruction and a CT scan of the abdomen is not indicated at this time. ? ?Plan is to treat this as severe constipation. ? ?Final Clinical Impression(s) / ED Diagnoses ?Final  diagnoses:  ?Ileus ([place])  ?Constipation, unspecified constipation type  ? ? ?Rx / DC Orders ?ED Discharge Orders   ? ?      Ordered  ?  bisacodyl (DULCOLAX) 5 MG EC tablet  2 times daily       ? 11/25/21 1437  ? ?  ?  ? ?  ? ? ?  ?Varney Biles, MD ?11/25/21 1438 ? ?

## 2021-11-25 NOTE — Discharge Instructions (Addendum)
You were seen in the ER for constipation. ?Take the medicine prescribed.  Ensure that you drink plenty of water. ? ?

## 2021-11-25 NOTE — ED Triage Notes (Signed)
C/o constipation x 3-4 days.  Reports abd pain and rectal pain.  ?

## 2022-04-14 ENCOUNTER — Emergency Department (HOSPITAL_COMMUNITY)
Admission: EM | Admit: 2022-04-14 | Discharge: 2022-04-14 | Disposition: A | Discharge status: Home / Self Care | Payer: Self-pay | Attending: Student | Admitting: Student

## 2022-04-14 ENCOUNTER — Other Ambulatory Visit: Payer: Self-pay

## 2022-04-14 ENCOUNTER — Encounter (HOSPITAL_COMMUNITY): Payer: Self-pay

## 2022-04-14 DIAGNOSIS — L0231 Cutaneous abscess of buttock: Secondary | ICD-10-CM | POA: Insufficient documentation

## 2022-04-14 LAB — CBC WITH DIFFERENTIAL/PLATELET
Abs Immature Granulocytes: 0.01 10*3/uL (ref 0.00–0.07)
Basophils Absolute: 0.1 10*3/uL (ref 0.0–0.1)
Basophils Relative: 1 %
Eosinophils Absolute: 0.1 10*3/uL (ref 0.0–0.5)
Eosinophils Relative: 1 %
HCT: 35.1 % — ABNORMAL LOW (ref 39.0–52.0)
Hemoglobin: 10.4 g/dL — ABNORMAL LOW (ref 13.0–17.0)
Immature Granulocytes: 0 %
Lymphocytes Relative: 14 %
Lymphs Abs: 0.9 10*3/uL (ref 0.7–4.0)
MCH: 23.4 pg — ABNORMAL LOW (ref 26.0–34.0)
MCHC: 29.6 g/dL — ABNORMAL LOW (ref 30.0–36.0)
MCV: 79.1 fL — ABNORMAL LOW (ref 80.0–100.0)
Monocytes Absolute: 0.4 10*3/uL (ref 0.1–1.0)
Monocytes Relative: 7 %
Neutro Abs: 4.8 10*3/uL (ref 1.7–7.7)
Neutrophils Relative %: 77 %
Platelets: 360 10*3/uL (ref 150–400)
RBC: 4.44 MIL/uL (ref 4.22–5.81)
RDW: 20.9 % — ABNORMAL HIGH (ref 11.5–15.5)
WBC: 6.2 10*3/uL (ref 4.0–10.5)
nRBC: 0 % (ref 0.0–0.2)

## 2022-04-14 LAB — BASIC METABOLIC PANEL
Anion gap: 8 (ref 5–15)
BUN: 12 mg/dL (ref 8–23)
CO2: 25 mmol/L (ref 22–32)
Calcium: 9.1 mg/dL (ref 8.9–10.3)
Chloride: 107 mmol/L (ref 98–111)
Creatinine, Ser: 0.9 mg/dL (ref 0.61–1.24)
GFR, Estimated: >60 mL/min (ref >60)
Glucose, Bld: 122 mg/dL — ABNORMAL HIGH (ref 70–99)
Potassium: 3.9 mmol/L (ref 3.5–5.1)
Sodium: 140 mmol/L (ref 135–145)

## 2022-04-14 MED ORDER — LIDOCAINE-EPINEPHRINE (PF) 2 %-1:200000 IJ SOLN
20.0000 mL | Freq: Once | INTRAMUSCULAR | Status: AC
  Administered 2022-04-14: 20 mL
  Filled 2022-04-14: qty 20

## 2022-04-14 MED ORDER — DOXYCYCLINE HYCLATE 100 MG PO CAPS
100.0000 mg | ORAL_CAPSULE | Freq: Two times a day (BID) | ORAL | 0 refills | Status: AC
Start: 2022-04-14 — End: ?
  Filled 2022-04-14: qty 20, 10d supply, fill #0

## 2022-04-14 NOTE — ED Notes (Signed)
Stepped outside 

## 2022-04-14 NOTE — ED Triage Notes (Signed)
Pt arrived POV from home c/o a abscess on his left butt cheek. Pt states it popped 3 days ago but it is still swelling and painful. Pt states stuff is still draining out of it.

## 2022-04-14 NOTE — ED Provider Notes (Signed)
[facility]PT [facility] Emergency Department Provider Note MRN:  [id number]  Arrival date & time: 04/14/22     Chief Complaint   Abscess   History of Present Illness   Travis Heath is a 61 y.o. year-old male with past medical history of anemia, polysubstance abuse, and homelessness presents to the ED with chief complaint of abscess. Abscess is located on the left buttock. Noticed the abscess about 5 days ago. "Busted" 2 days ago and started oozing "pus". Sitting sideways to relieve pressure on the left buttock has helped. Has taken tylenol for pain which has helped a little.      Review of Systems  Pertinent positive and negative review of systems noted in HPI.    Physical Exam   Vitals:   04/14/22 2020 04/14/22 2030  BP: 116/60 125/84  Pulse: (!) 56 65  Resp: 15 15  Temp: 98.9 F (37.2 C)   SpO2: 98% 98%    CONSTITUTIONAL:  well-appearing, NAD NEURO:  Alert and oriented x 3, CN 3-12 grossly intact EYES:  eyes equal and reactive ENT/NECK:  Supple, no stridor  CARDIO: regular rhythm, appears well-perfused  PULM:  No respiratory distress GI/GU:  non-distended, soft MSK/SPINE:  No gross deformities, no edema, moves all extremities SKIN:  oozing pustulant, loculated wound on the mid left buttock near the intergluteal cleft.  Fluctuant noted circumferentially.  Induration extends approximately 2.5 cm radially from the wound.    *Additional and/or pertinent findings included in MDM below  Diagnostic and Interventional Summary   Labs Reviewed  CBC WITH DIFFERENTIAL/PLATELET - Abnormal; Notable for the following components:      Result Value   Hemoglobin 10.4 (*)    HCT 35.1 (*)    MCV 79.1 (*)    MCH 23.4 (*)    MCHC 29.6 (*)    RDW 20.9 (*)    All other components within normal limits  BASIC METABOLIC PANEL - Abnormal; Notable for the following components:   Glucose, Bld 122 (*)    All other components within normal limits    No orders to display     Medications  lidocaine-EPINEPHrine (XYLOCAINE W/EPI) 2 %-1:200000 (PF) injection 20 mL (has no administration in time range)     Procedures  /  Critical Care .Marland KitchenIncision and Drainage  Date/Time: 04/14/2022 8:50 PM  Performed by: Gareth Eagle, PA-C Authorized by: Gareth Eagle, PA-C   Consent:    Consent obtained:  Verbal   Consent given by:  Patient   Risks discussed:  Infection, incomplete drainage and pain Universal protocol:    Procedure explained and questions answered to patient or proxy's satisfaction: yes     Patient identity confirmed:  Verbally with patient and arm band Location:    Type:  Abscess   Size:  2 cm   Location:  Anogenital   Anogenital location: inter gluteal cleft. Pre-procedure details:    Skin preparation:  Chlorhexidine with alcohol Sedation:    Sedation type:  None Anesthesia:    Anesthesia method:  Local infiltration   Local anesthetic:  Lidocaine 2% WITH epi Procedure details:    Wound management:  Probed and deloculated   Drainage:  Bloody and purulent   Wound treatment:  Wound left open   Packing materials:  1/4 in iodoform gauze   Amount 1/4" iodoform:  8 inches Post-procedure details:    Procedure completion:  Tolerated well, no immediate complications   ED Course and Medical Decision Making  I have reviewed the  triage vital signs, the nursing notes, and pertinent available records from the EMR.  Social Determinants Affecting Complexity of Care: Patient .   ED Course:   Patient presented with wound located on the left buttock.  Differential diagnosis for this complaint includes infectious abscess, bacteremia, and cellulitis which carries a moderate risk of morbidity mortality. Considered bacteremia but unlikely given normal white blood cell count and no fever. Considered cellulitis but unlikely given warm or erythematous. Around the wound exam revealed strong suspicion for abscess. Abscess is oozing with a significant degree of  induration beneath the wound suggestive of large pustulant fluid burden. Applied field block with lido with epi. Procedure explained above. Expressed a small amount of bloody pustulant fluid.  Also excised what appeared to be an infected cyst about 1 cm in diameter.  Packed with iodoform.  Discussed risk of infection with associated abscess.  Ordered 10-day course of doxycycline for infection prophylaxis.  Advised to follow-up with PCP.  Discussed return precautions.  Medical Decision Making Amount and/or Complexity of Data Reviewed External Data Reviewed: notes.    Details: Chart review for medical history which revealed history of anemia polysubstance abuse and homelessness Labs: ordered. Decision-making details documented in ED Course.    Details: anemia, hyperglycemia    Reevaluation: Patient reported that after administration of field block around the wound pain felt much better and for this reason he expressed that the procedure was well-tolerated.  Consultants: No consultations were needed in caring for this patient.   Treatment and Plan: Emergency department workup does not suggest an emergent condition requiring admission or immediate intervention beyond  what has been performed at this time. The patient is safe for discharge and has  been instructed to return immediately for worsening symptoms, change in  symptoms or any other concerns    Final Clinical Impressions(s) / ED Diagnoses     ICD-10-CM   1. Abscess of buttock, left  L02.31       ED Discharge Orders          Ordered    doxycycline (VIBRAMYCIN) 100 MG capsule  2 times daily        04/14/22 2020                   Gareth Eagle, PA-C 04/14/22 2109    Kommor, Wyn Forster, MD 04/14/22 2131

## 2022-04-14 NOTE — ED Notes (Addendum)
Pt back to room from w/r, EDPA into see.

## 2022-04-14 NOTE — Discharge Instructions (Addendum)
Diagnosed with abscess of the left buttock.  Incision and drainage was successful and I was able to remove what appeared to be an infectious cyst. I have ordered a 10-day course of doxycycline for possible infection associated with the abscess. Please go to urgent care or come back to the ED in two days to assess the progress of wound healing and to remove the iodoform packing. If you have new fever, nausea and vomiting, or new chest pain or shortness of breath or new pain radiating down your leg or to your back please return to the ED for further evaluation.

## 2022-04-14 NOTE — ED Provider Triage Note (Signed)
Emergency Medicine Provider Triage Evaluation Note  Greely Atiyeh , a 61 y.o. male  was evaluated in triage.  Pt complains of an perirectal abscess.  Has prior history of this.  States this flared up 3 days ago.  Spontaneously drained 2 days ago.  Denies fever, chills..  Review of Systems  Positive: As above Negative: As above  Physical Exam  BP 127/84 (BP Location: Right Arm)   Pulse 75   Temp 98.8 F (37.1 C) (Oral)   Resp 16   SpO2 99%  Gen:   Awake, no distress   Resp:  Normal effort  MSK:   Moves extremities without difficulty  Other:    Medical Decision Making  Medically screening exam initiated at 12:56 PM.  Appropriate orders placed.  Joshau Code was informed that the remainder of the evaluation will be completed by another provider, this initial triage assessment does not replace that evaluation, and the importance of remaining in the ED until their evaluation is complete.     Marita Kansas, PA-C 04/14/22 1257

## 2022-04-15 ENCOUNTER — Other Ambulatory Visit (HOSPITAL_COMMUNITY): Payer: Self-pay

## 2022-09-08 ENCOUNTER — Encounter (HOSPITAL_COMMUNITY): Payer: Self-pay | Admitting: Emergency Medicine

## 2022-09-08 ENCOUNTER — Emergency Department (HOSPITAL_COMMUNITY)
Admission: EM | Admit: 2022-09-08 | Discharge: 2022-09-09 | Disposition: A | Discharge status: Home / Self Care | Payer: Medicaid Other | Attending: Emergency Medicine | Admitting: Emergency Medicine

## 2022-09-08 DIAGNOSIS — D649 Anemia, unspecified: Secondary | ICD-10-CM | POA: Diagnosis not present

## 2022-09-08 DIAGNOSIS — D72819 Decreased white blood cell count, unspecified: Secondary | ICD-10-CM | POA: Diagnosis not present

## 2022-09-08 DIAGNOSIS — K529 Noninfective gastroenteritis and colitis, unspecified: Secondary | ICD-10-CM | POA: Insufficient documentation

## 2022-09-08 DIAGNOSIS — Z20822 Contact with and (suspected) exposure to covid-19: Secondary | ICD-10-CM | POA: Insufficient documentation

## 2022-09-08 DIAGNOSIS — R112 Nausea with vomiting, unspecified: Secondary | ICD-10-CM | POA: Diagnosis present

## 2022-09-08 LAB — URINALYSIS, ROUTINE W REFLEX MICROSCOPIC
Bacteria, UA: NONE SEEN
Bilirubin Urine: NEGATIVE
Glucose, UA: NEGATIVE mg/dL
Hgb urine dipstick: NEGATIVE
Ketones, ur: NEGATIVE mg/dL
Leukocytes,Ua: NEGATIVE
Nitrite: NEGATIVE
Protein, ur: 30 mg/dL — AB
Specific Gravity, Urine: 1.028 (ref 1.005–1.030)
pH: 5 (ref 5.0–8.0)

## 2022-09-08 LAB — CBC WITH DIFFERENTIAL/PLATELET
Abs Immature Granulocytes: 0.01 10*3/uL (ref 0.00–0.07)
Basophils Absolute: 0 10*3/uL (ref 0.0–0.1)
Basophils Relative: 0 %
Eosinophils Absolute: 0.1 10*3/uL (ref 0.0–0.5)
Eosinophils Relative: 3 %
HCT: 34.6 % — ABNORMAL LOW (ref 39.0–52.0)
Hemoglobin: 10.5 g/dL — ABNORMAL LOW (ref 13.0–17.0)
Immature Granulocytes: 0 %
Lymphocytes Relative: 22 %
Lymphs Abs: 0.8 10*3/uL (ref 0.7–4.0)
MCH: 24.7 pg — ABNORMAL LOW (ref 26.0–34.0)
MCHC: 30.3 g/dL (ref 30.0–36.0)
MCV: 81.4 fL (ref 80.0–100.0)
Monocytes Absolute: 0.3 10*3/uL (ref 0.1–1.0)
Monocytes Relative: 9 %
Neutro Abs: 2.3 10*3/uL (ref 1.7–7.7)
Neutrophils Relative %: 66 %
Platelets: 347 10*3/uL (ref 150–400)
RBC: 4.25 MIL/uL (ref 4.22–5.81)
RDW: 15.9 % — ABNORMAL HIGH (ref 11.5–15.5)
WBC: 3.5 10*3/uL — ABNORMAL LOW (ref 4.0–10.5)
nRBC: 0 % (ref 0.0–0.2)

## 2022-09-08 LAB — COMPREHENSIVE METABOLIC PANEL
ALT: 14 U/L (ref 0–44)
AST: 20 U/L (ref 15–41)
Albumin: 3.7 g/dL (ref 3.5–5.0)
Alkaline Phosphatase: 53 U/L (ref 38–126)
Anion gap: 5 (ref 5–15)
BUN: 17 mg/dL (ref 8–23)
CO2: 27 mmol/L (ref 22–32)
Calcium: 8.6 mg/dL — ABNORMAL LOW (ref 8.9–10.3)
Chloride: 105 mmol/L (ref 98–111)
Creatinine, Ser: 0.95 mg/dL (ref 0.61–1.24)
GFR, Estimated: >60 mL/min (ref >60)
Glucose, Bld: 116 mg/dL — ABNORMAL HIGH (ref 70–99)
Potassium: 3.6 mmol/L (ref 3.5–5.1)
Sodium: 137 mmol/L (ref 135–145)
Total Bilirubin: 0.3 mg/dL (ref 0.3–1.2)
Total Protein: 7.5 g/dL (ref 6.5–8.1)

## 2022-09-08 LAB — RESP PANEL BY RT-PCR (RSV, FLU A&B, COVID)  RVPGX2
Influenza A by PCR: NEGATIVE
Influenza B by PCR: NEGATIVE
Resp Syncytial Virus by PCR: NEGATIVE
SARS Coronavirus 2 by RT PCR: NEGATIVE

## 2022-09-08 LAB — LIPASE, BLOOD: Lipase: 32 U/L (ref 11–51)

## 2022-09-08 NOTE — ED Triage Notes (Signed)
Pt here from home with c/o n/v/d times 3 days , stated that his fridge went out and he may have ate something bad

## 2022-09-08 NOTE — ED Provider Triage Note (Signed)
Emergency Medicine Provider Triage Evaluation Note  Benjermin Korber , a 62 y.o. male  was evaluated in triage.  Pt complains of n/v/d- thinks he ate some bad food. Denies cough, or fever. No hx of abd surgery..  Review of Systems  Positive: Ln/v/d Negative: Fever   Physical Exam  BP 110/65   Pulse 77   Temp 98.2 F (36.8 C) (Oral)   Resp 18   SpO2 97%  Gen:   Awake, no distress   Resp:  Normal effort  MSK:   Moves extremities without difficulty  Other:    Medical Decision Making  Medically screening exam initiated at 7:27 PM.  Appropriate orders placed.  Angell Honse was informed that the remainder of the evaluation will be completed by another provider, this initial triage assessment does not replace that evaluation, and the importance of remaining in the ED until their evaluation is complete.     Margarita Mail, PA-C 09/08/22 1932

## 2022-09-09 MED ORDER — ONDANSETRON 4 MG PO TBDP
4.0000 mg | ORAL_TABLET | Freq: Once | ORAL | Status: AC
  Administered 2022-09-09: 4 mg via ORAL
  Filled 2022-09-09: qty 1

## 2022-09-09 MED ORDER — ONDANSETRON 4 MG PO TBDP: 4.0000 mg | ORAL_TABLET | Freq: Three times a day (TID) | ORAL | 0 refills | Status: AC | PRN

## 2022-09-09 NOTE — Discharge Instructions (Signed)
You were seen in the ER today for nausea vomiting diarrhea.  Your symptoms are likely secondary to either viral illness or to foodborne illness.  You have been prescribed nausea medication to use as needed.,  You may use over-the-counter Imodium as needed for diarrhea.  Please make sure you are increasing your hydration including electrolyte supplementation with drink such as body armor, Pedialyte, or Gatorade.  Follow-up with your primary care doctor and return to the ER with any new severe symptoms

## 2022-09-09 NOTE — ED Provider Notes (Signed)
[facility] DEPT Provider Note   CSN: [id number] Arrival date & time: 09/08/22  2202     History  No chief complaint on file.   Travis Heath is a 62 y.o. male who presents with concern for abdominal pain, N/V/D that started 2 days ago after eating food from the fridge, which had not been functioning for several days.   He states >10 episodes of NBNB emesis, several episodes of diarrhea without melena or hematochezia. Mild generalized abdominal pain prompting ED visit.   I have reviewed his medical records, hx of polysubstance abuse, epistaxis. No medications daily.  HPI     Home Medications Prior to Admission medications   Medication Sig Start Date End Date Taking? Authorizing Provider  ondansetron (ZOFRAN-ODT) 4 MG disintegrating tablet Take 1 tablet (4 mg total) by mouth every 8 (eight) hours as needed for nausea or vomiting. 09/09/22  Yes Travis Heath, Travis Balsam, PA-C  acetaminophen (TYLENOL) 325 MG tablet Take 325 mg by mouth every 6 (six) hours as needed for mild pain or moderate pain.    [provider]  bisacodyl (DULCOLAX) 5 MG EC tablet Take 1 tablet (5 mg total) by mouth 2 (two) times daily. 11/25/21   Travis Biles, MD  doxycycline (VIBRAMYCIN) 100 MG capsule Take 1 capsule (100 mg total) by mouth 2 (two) times daily. 04/14/22   Travis Pho, PA-C  meloxicam (MOBIC) 15 MG tablet Take 15 mg by mouth daily. 10/04/21   [provider]  methocarbamol (ROBAXIN) 500 MG tablet Take 1 tablet (500 mg total) by mouth 2 (two) times daily. 09/26/21   Travis Sias, PA  pantoprazole (PROTONIX) 40 MG tablet Take 1 tablet (40 mg total) by mouth 2 (two) times daily. Patient not taking: Reported on 11/25/2021 08/20/21   Travis Reeve, DO      Allergies    Patient has no known allergies.    Review of Systems   Review of Systems  Constitutional:  Positive for appetite change. Negative for fever.  HENT: Negative.    Gastrointestinal:   Positive for abdominal pain, diarrhea, nausea and vomiting.  Genitourinary: Negative.     Physical Exam Updated Vital Signs BP 125/79 (BP Location: Right Arm)   Pulse 68   Temp 99 F (37.2 C) (Oral)   Resp 16   SpO2 99%  Physical Exam Vitals and nursing note reviewed.  Constitutional:      Appearance: He is not ill-appearing or toxic-appearing.  HENT:     Head: Normocephalic and atraumatic.     Mouth/Throat:     Mouth: Mucous membranes are moist.     Pharynx: No oropharyngeal exudate or posterior oropharyngeal erythema.  Eyes:     General:        Right eye: No discharge.        Left eye: No discharge.     Extraocular Movements: Extraocular movements intact.     Conjunctiva/sclera: Conjunctivae normal.     Pupils: Pupils are equal, round, and reactive to light.  Cardiovascular:     Rate and Rhythm: Normal rate and regular rhythm.     Pulses: Normal pulses.     Heart sounds: Normal heart sounds.  Pulmonary:     Effort: Pulmonary effort is normal. No respiratory distress.     Breath sounds: Normal breath sounds. No wheezing or rales.  Abdominal:     General: Bowel sounds are normal. There is no distension.     Palpations: Abdomen is soft.  Tenderness: There is no abdominal tenderness. There is no right CVA tenderness, left CVA tenderness, guarding or rebound.  Musculoskeletal:        General: No deformity.     Cervical back: Neck supple.  Skin:    General: Skin is warm and dry.     Capillary Refill: Capillary refill takes less than 2 seconds.  Neurological:     General: No focal deficit present.     Mental Status: He is alert and oriented to person, place, and time. Mental status is at baseline.  Psychiatric:        Mood and Affect: Mood normal.     ED Results / Procedures / Treatments   Labs (all labs ordered are listed, but only abnormal results are displayed) Labs Reviewed  COMPREHENSIVE METABOLIC PANEL - Abnormal; Notable for the following components:       Result Value   Glucose, Bld 116 (*)    Calcium 8.6 (*)    All other components within normal limits  CBC WITH DIFFERENTIAL/PLATELET - Abnormal; Notable for the following components:   WBC 3.5 (*)    Hemoglobin 10.5 (*)    HCT 34.6 (*)    MCH 24.7 (*)    RDW 15.9 (*)    All other components within normal limits  URINALYSIS, ROUTINE W REFLEX MICROSCOPIC - Abnormal; Notable for the following components:   APPearance HAZY (*)    Protein, ur 30 (*)    All other components within normal limits  RESP PANEL BY RT-PCR (RSV, FLU A&B, COVID)  RVPGX2  LIPASE, BLOOD    EKG None  Radiology No results found.  Procedures Procedures    Medications Ordered in ED Medications  ondansetron (ZOFRAN-ODT) disintegrating tablet 4 mg (has no administration in time range)    ED Course/ Medical Decision Making/ A&P                             Medical Decision Making 62 year old male present with nausea vomiting diarrhea.  Normal vital signs on intake.  Cardiopulmonary abdominal exams are benign.  Patient well-appearing, tolerating p.o., moist mucous membranes, normal heart rate, good skin turgor.  Clinically appears well-hydrated.  DDx includes not limited to acute viral gastroenteritis, foodborne illness, intra-abdominal infection such as cholecystitis, appendicitis, diverticulitis.  Amount and/or Complexity of Data Reviewed Labs: ordered.    Details: CBC with mild leukopenia 3.5, mild anemia with hemoglobin of 10.5 at baseline.  CMP unremarkable, RVP negative, UA without evidence of infection.    Risk Prescription drug management.   Physical exam very reassuring, laboratory studies as well.  Clinically doubt intra-abdominal infection on warrant imaging in the emergency department tonight.  Suspect foodborne illness versus viral gastroenteritis.  ODT Zofran offered in the emergency department, will discharge with prescription for nausea medication recommendation to use over-the-counter  Imodium as needed.  Recommend increase hydration with electrolyte supplementation.  Clinical concern for emergent underlying etiology would warrant further ED workup or inpatient management is exceedingly low.  Travis Heath  voiced understanding of his medical evaluation and treatment plan. Each of their questions answered to their expressed satisfaction.  Return precautions were given.  Patient is well-appearing, stable, and was discharged in good condition.  This chart was dictated using voice recognition software, Dragon. Despite the best efforts of this provider to proofread and correct errors, errors may still occur which can change documentation meaning.  Final Clinical Impression(s) / ED Diagnoses Final diagnoses:  Gastroenteritis  Rx / DC Orders ED Discharge Orders          Ordered    ondansetron (ZOFRAN-ODT) 4 MG disintegrating tablet  Every 8 hours PRN        09/09/22 0151              Marybel Alcott, Eugene Gavia, PA-C 09/09/22 0157    Glynn Octave, MD 09/09/22 [phone]

## 2023-01-26 IMAGING — DX DG ABDOMEN ACUTE W/ 1V CHEST
3 series · 3 of 3 positions shown · non-contrast
Comparison: None.

CLINICAL DATA: Constipation, abdominal pain

EXAM:
DG ABDOMEN ACUTE WITH 1 VIEW CHEST

[chest pa]
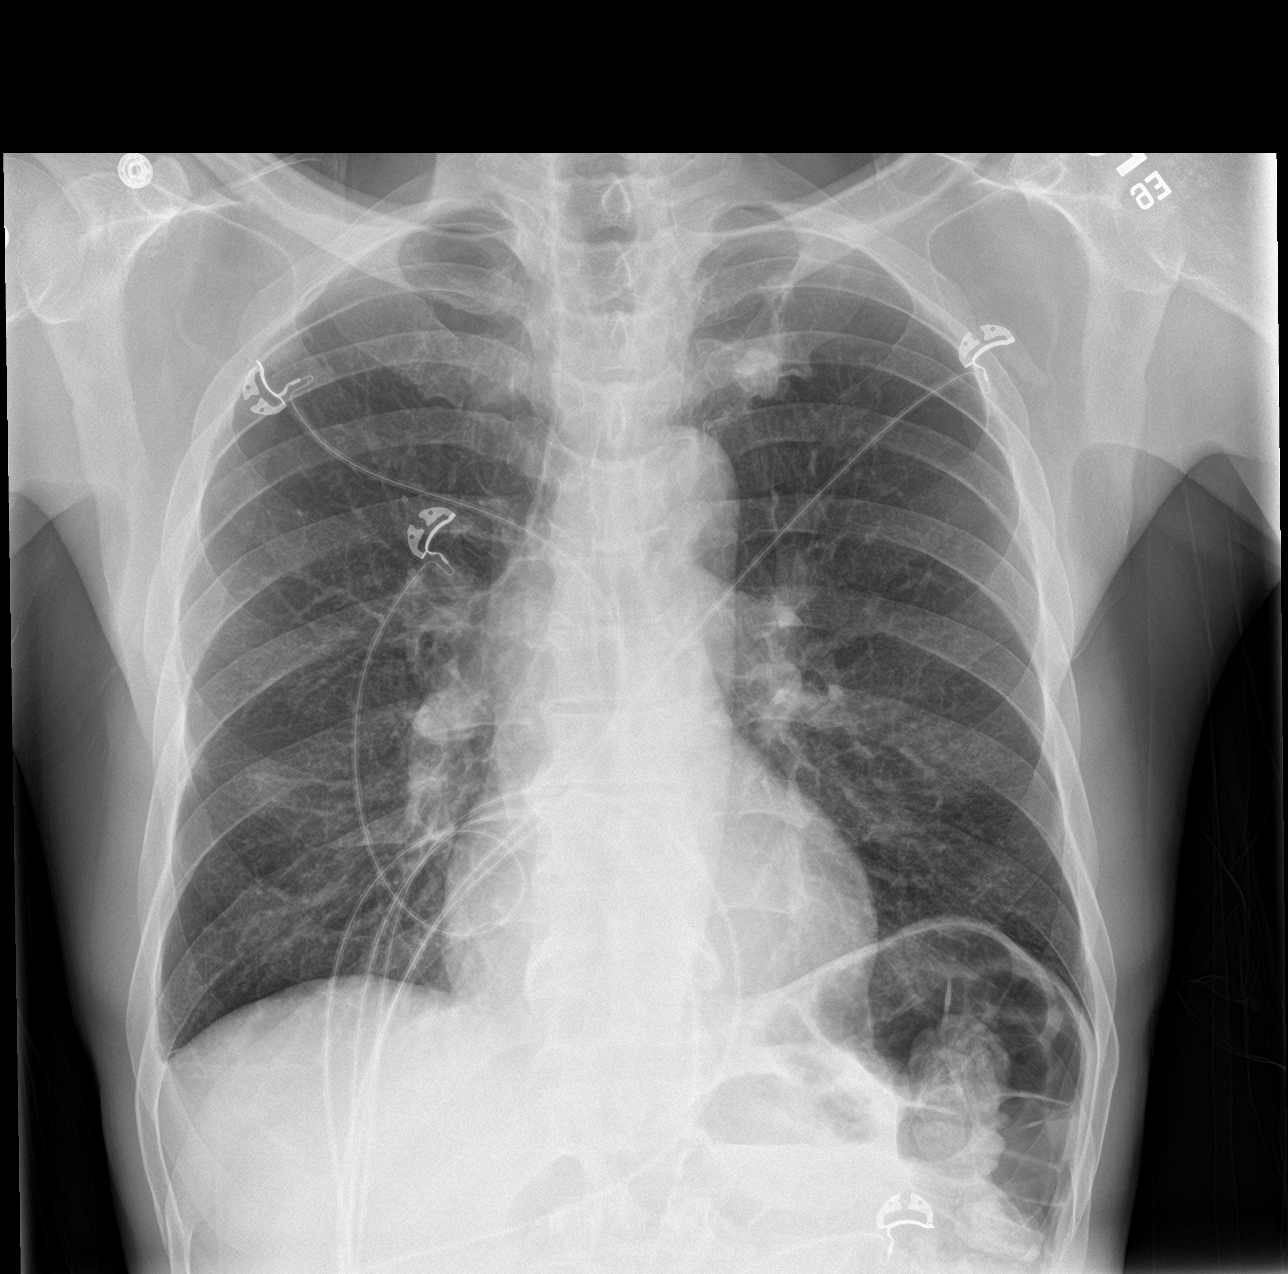

[abdomen supine]
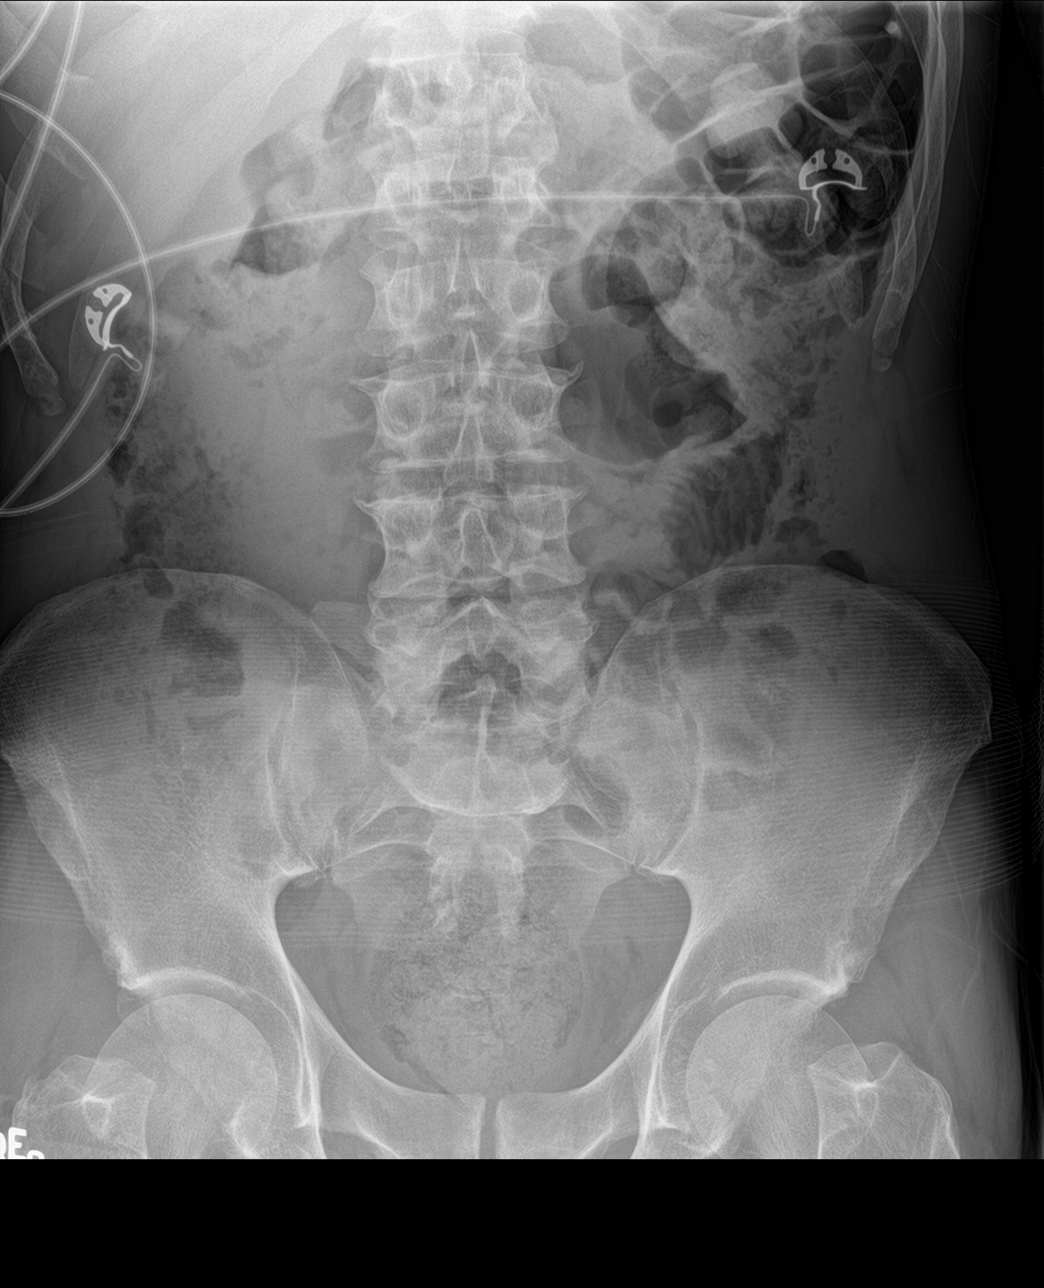

[abdomen erect]
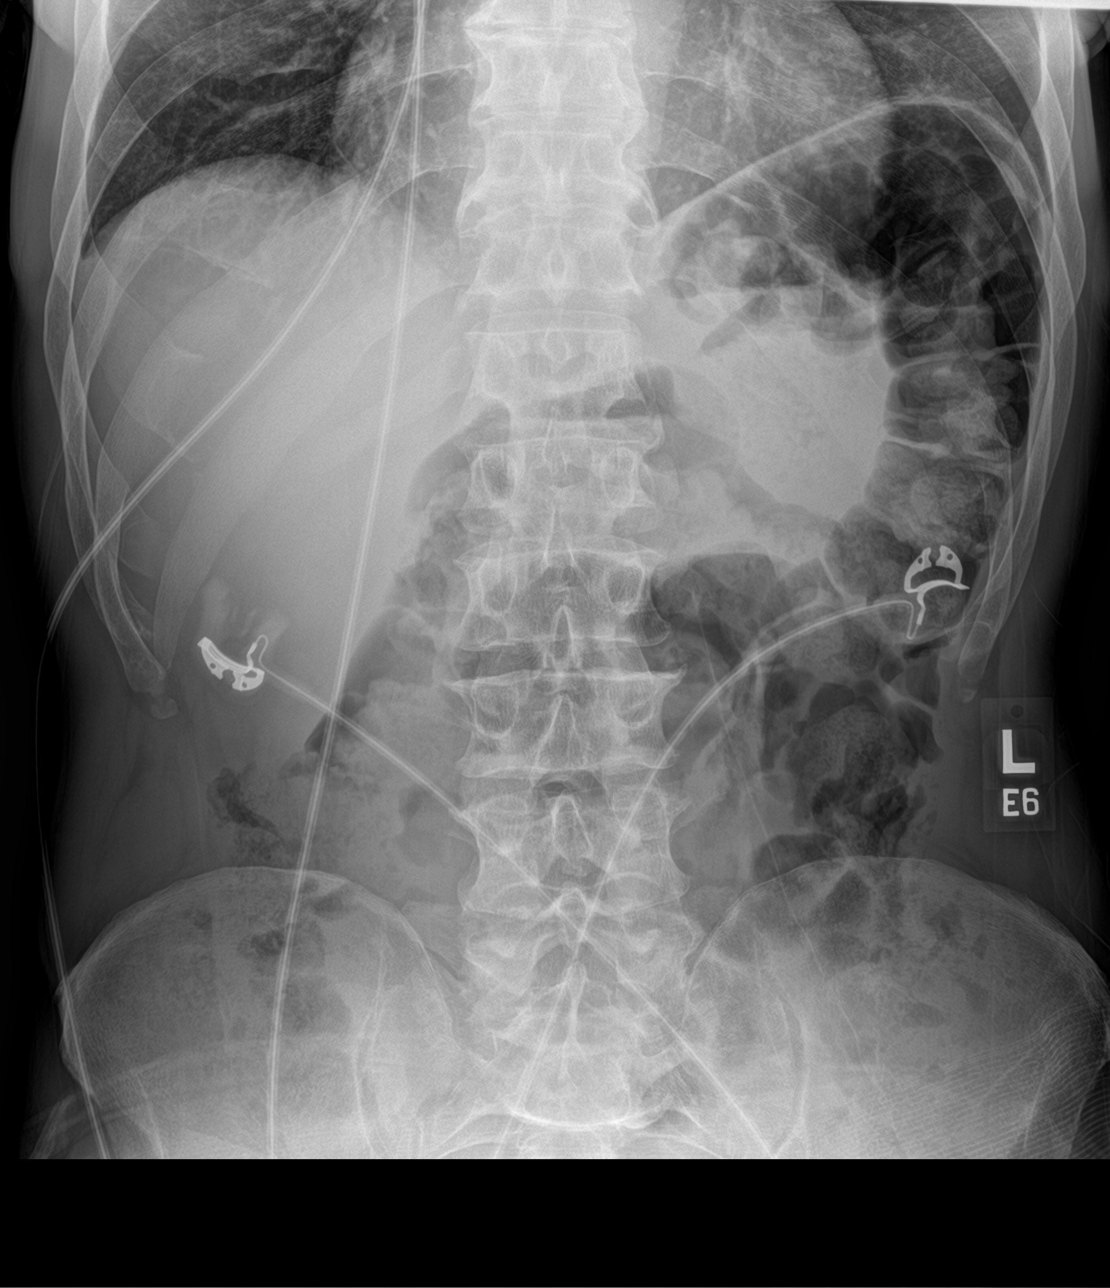

[3 of 3 positions shown; findings below may reference images not displayed]

FINDINGS: Cardiac size is within normal limits. Lung fields are clear of any
infiltrates or pulmonary edema. There is no pleural effusion or
pneumothorax. There is presence of gas in few slightly dilated small
bowel loops in the left mid abdomen. There are no air-fluid levels
in the small bowel loops. Moderate amount of stool is seen in the
colon and rectum. No abnormal masses or calcifications are seen.
Kidneys are mostly obscured by bowel contents limiting evaluation
for small stones. Degenerative changes are noted in the lumbar spine
with bony spurs.
IMPRESSION: Presence of gas in few small bowel loops in the left mid abdomen may
suggest ileus. There are no air-fluid levels in the small bowel
loops. There is no pneumoperitoneum.

No active disease is seen in the chest.

## 2023-09-09 NOTE — Congregational Nurse Program (Signed)
  Dept: [phone]   Congregational Nurse Program Note  Date of Encounter: 09/09/2023  Past Medical History: Past Medical History:  Diagnosis Date   Arthritis     Encounter Details:  Community Questionnaire - 09/09/23 0954       Questionnaire   Ask client: Do you give verbal consent for me to treat you today? Yes    Student Assistance N/A    Location Patient Served  [facility]    Encounter Setting CN site    Population Status Unhoused    Insurance Unknown;Medicaid    Insurance/Financial Assistance Referral Medicaid    Medication N/A    Medical Provider Yes    Screening Referrals Made N/A    Medical Referrals Made N/A    Medical Appointment Completed N/A    CNP Interventions Advocate/Support;Navigate Healthcare System;Case Management;Counsel;Educate    Screenings CN Performed Blood Pressure    ED Visit Averted N/A    Life-Saving Intervention Made N/A            RN assisted client with requests with renewal of his medicaid. RN assisted with calling medicaid. Unable to get someone on phone but will re-visit and consult with CSW team at [facility] to help him get re-established on medicaid.

## 2023-10-23 NOTE — Congregational Nurse Program (Signed)
 Client is requesting assistance for physical therapy for sciatic pain. He is not sure if he has medicaid so RN called [facility] in [place]. Client does actually have medicaid so we cancelled appointment and are getting him a new PCP to get referral to Physical therapy. RN called [facility], he will have to do registration paperwork first before he can be seen by PCP or make an appointment. Will follow up at that time.

## 2023-11-17 NOTE — Congregational Nurse Program (Signed)
 Client to RN office in need of reading glasses. He was able to try on a few pair and read for eye exam with RN 3.75 selected. No other needs at this time.
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
## 2023-12-23 ENCOUNTER — Encounter (HOSPITAL_COMMUNITY): Payer: Self-pay

## 2023-12-23 ENCOUNTER — Emergency Department (HOSPITAL_COMMUNITY)
Admission: EM | Admit: 2023-12-23 | Discharge: 2023-12-23 | Disposition: A | Discharge status: Home / Self Care | Attending: Emergency Medicine | Admitting: Emergency Medicine

## 2023-12-23 ENCOUNTER — Other Ambulatory Visit: Payer: Self-pay

## 2023-12-23 DIAGNOSIS — M5431 Sciatica, right side: Secondary | ICD-10-CM | POA: Diagnosis present

## 2023-12-23 DIAGNOSIS — M150 Primary generalized (osteo)arthritis: Secondary | ICD-10-CM | POA: Insufficient documentation

## 2023-12-23 DIAGNOSIS — M1990 Unspecified osteoarthritis, unspecified site: Secondary | ICD-10-CM

## 2023-12-23 DIAGNOSIS — M5432 Sciatica, left side: Secondary | ICD-10-CM | POA: Insufficient documentation

## 2023-12-23 DIAGNOSIS — M25511 Pain in right shoulder: Secondary | ICD-10-CM | POA: Diagnosis not present

## 2023-12-23 MED ORDER — METHOCARBAMOL 500 MG PO TABS: 500.0000 mg | ORAL_TABLET | Freq: Two times a day (BID) | ORAL | 1 refills | Status: AC

## 2023-12-23 MED ORDER — MELOXICAM 15 MG PO TABS: 15.0000 mg | ORAL_TABLET | Freq: Every day | ORAL | 1 refills | Status: AC

## 2023-12-23 NOTE — ED Provider Notes (Signed)
  EMERGENCY DEPARTMENT AT [facility] Provider Note   CSN: [id number] Arrival date & time: 12/23/23  1316     History  Chief Complaint  Patient presents with   Generalized Body Aches    Travis Heath is a 63 y.o. male.  Patient complains of sciatica in both legs.  Patient states he is not currently on any medications he is requesting medication to help with sciatica.  He does not recall what medications he was on in the past.  Patient denies any new area of pain.  He has not had any numbness.  Patient states he has been treated for this here in the past.  Patient denies any fever or chills.  He is not having pain in his back he is having the pain in his buttocks.  The history is provided by the patient. No language interpreter was used.       Home Medications Prior to Admission medications   Medication Sig Start Date End Date Taking? Authorizing Provider  acetaminophen  (TYLENOL ) 325 MG tablet Take 325 mg by mouth every 6 (six) hours as needed for mild pain or moderate pain.    [provider]  bisacodyl  (DULCOLAX) 5 MG EC tablet Take 1 tablet (5 mg total) by mouth 2 (two) times daily. 11/25/21   Deatra Face, MD  doxycycline  (VIBRAMYCIN ) 100 MG capsule Take 1 capsule (100 mg total) by mouth 2 (two) times daily. 04/14/22   Robinson, John K, PA-C  meloxicam (MOBIC) 15 MG tablet Take 1 tablet (15 mg total) by mouth daily. 12/23/23   Sandi Crosby, PA-C  methocarbamol  (ROBAXIN ) 500 MG tablet Take 1 tablet (500 mg total) by mouth 2 (two) times daily. 12/23/23   Paulyne Mooty K, PA-C  ondansetron  (ZOFRAN -ODT) 4 MG disintegrating tablet Take 1 tablet (4 mg total) by mouth every 8 (eight) hours as needed for nausea or vomiting. 09/09/22   Sponseller, Adelle Agent, PA-C  pantoprazole  (PROTONIX ) 40 MG tablet Take 1 tablet (40 mg total) by mouth 2 (two) times daily. Patient not taking: Reported on 11/25/2021 08/20/21   Alexander, Natalie, DO      Allergies    Patient  has no known allergies.    Review of Systems   Review of Systems  All other systems reviewed and are negative.   Physical Exam Updated Vital Signs BP 112/71 (BP Location: Right Arm)   Pulse 78   Temp 99.2 F (37.3 C)   Resp 18   Ht 6' (1.829 m)   Wt 88.5 kg   SpO2 96%   BMI 26.45 kg/m  Physical Exam Vitals and nursing note reviewed.  Constitutional:      Appearance: He is well-developed.  HENT:     Head: Normocephalic.  Cardiovascular:     Rate and Rhythm: Normal rate.  Pulmonary:     Effort: Pulmonary effort is normal.  Abdominal:     General: There is no distension.  Musculoskeletal:        General: Normal range of motion.  Skin:    General: Skin is warm.  Neurological:     General: No focal deficit present.     Mental Status: He is alert and oriented to person, place, and time.  Psychiatric:        Mood and Affect: Mood normal.     ED Results / Procedures / Treatments   Labs (all labs ordered are listed, but only abnormal results are displayed) Labs Reviewed - No data to display  EKG None  Radiology No results found.  Procedures Procedures    Medications Ordered in ED Medications - No data to display  ED Course/ Medical Decision Making/ A&P                                 Medical Decision Making Patient is requesting medication for his chronic sciatica  Risk Prescription drug management.  Robaxin  and meloxicam.  He has had both of these in the past and tolerated them well.  Patient is discharged in stable condition he is advised to follow-up with primary care for recheck.         Final Clinical Impression(s) / ED Diagnoses Final diagnoses:  Bilateral sciatica  Acute pain of right shoulder  Arthritis    Rx / DC Orders ED Discharge Orders          Ordered    meloxicam (MOBIC) 15 MG tablet  Daily        12/23/23 1348    methocarbamol  (ROBAXIN ) 500 MG tablet  2 times daily        12/23/23 1348           An After Visit  Summary was printed and given to the patient.    Archie Atilano K, PA-C 12/23/23 2233    Mozell Arias, MD 12/24/23 [phone]

## 2023-12-23 NOTE — Discharge Instructions (Addendum)
 Return if any problems.  Follow up with primary care for on going medical management

## 2023-12-23 NOTE — ED Triage Notes (Signed)
 Complains of generalized body pains that are chronic including sciatica.  Patient appears in no distress.

## 2024-01-08 ENCOUNTER — Other Ambulatory Visit: Payer: Self-pay

## 2024-01-08 MED ORDER — MELOXICAM 15 MG PO TABS
15.0000 mg | ORAL_TABLET | Freq: Every day | ORAL | 2 refills | Status: AC
  Filled 2024-01-08: qty 30, 30d supply, fill #0

## 2024-01-08 MED ORDER — METHOCARBAMOL 500 MG PO TABS
500.0000 mg | ORAL_TABLET | Freq: Two times a day (BID) | ORAL | 2 refills | Status: AC
  Filled 2024-01-08: qty 60, 30d supply, fill #0

## 2024-01-08 NOTE — Congregational Nurse Program (Signed)
 Client requesting help from RN. Client states that he has medicine that he cannot afford to pay for at this time. RN then called and got his medicines transferred to [facility] on [address] to help him cover the expenses. He was given two bus passes to assist with this. He will pick up and has no further needs from RN at this time.

## 2024-01-20 ENCOUNTER — Other Ambulatory Visit: Payer: Self-pay

## 2024-03-05 ENCOUNTER — Ambulatory Visit: Payer: Self-pay | Admitting: Nurse Practitioner
